# Patient Record
Sex: Female | Born: 1950 | Race: White | Hispanic: No | Marital: Married | Smoking: Never smoker
Health system: Southern US, Community
[De-identification: ages and names within clinical notes are randomized; demographics above are authoritative.]

## PROBLEM LIST (undated history)

## (undated) DIAGNOSIS — R809 Proteinuria, unspecified: Secondary | ICD-10-CM

## (undated) DIAGNOSIS — K228 Other specified diseases of esophagus: Secondary | ICD-10-CM

## (undated) DIAGNOSIS — E119 Type 2 diabetes mellitus without complications: Secondary | ICD-10-CM

## (undated) DIAGNOSIS — G629 Polyneuropathy, unspecified: Secondary | ICD-10-CM

## (undated) DIAGNOSIS — I10 Essential (primary) hypertension: Secondary | ICD-10-CM

## (undated) DIAGNOSIS — K2289 Other specified disease of esophagus: Secondary | ICD-10-CM

## (undated) DIAGNOSIS — I2510 Atherosclerotic heart disease of native coronary artery without angina pectoris: Secondary | ICD-10-CM

## (undated) DIAGNOSIS — N950 Postmenopausal bleeding: Secondary | ICD-10-CM

## (undated) DIAGNOSIS — E7800 Pure hypercholesterolemia, unspecified: Secondary | ICD-10-CM

## (undated) HISTORY — PX: OTHER SURGICAL HISTORY: SHX169

## (undated) HISTORY — PX: TONSILLECTOMY: SUR1361

## (undated) HISTORY — PX: BIOPSY BREAST: PRO8

## (undated) HISTORY — PX: TUBAL LIGATION: SHX77

---

## 2003-08-01 HISTORY — PX: OTHER SURGICAL HISTORY: SHX169

## 2004-04-15 HISTORY — PX: CHOLECYSTECTOMY: SHX55

## 2004-07-31 HISTORY — PX: CORONARY ANGIOPLASTY WITH STENT PLACEMENT: SHX49

## 2004-12-07 ENCOUNTER — Ambulatory Visit: Payer: Self-pay | Admitting: Unknown Physician Specialty

## 2004-12-29 ENCOUNTER — Ambulatory Visit: Payer: Self-pay | Admitting: Internal Medicine

## 2004-12-29 ENCOUNTER — Other Ambulatory Visit: Payer: Self-pay

## 2005-01-24 ENCOUNTER — Encounter: Payer: Self-pay | Admitting: Cardiology

## 2005-01-28 ENCOUNTER — Encounter: Payer: Self-pay | Admitting: Cardiology

## 2005-02-28 ENCOUNTER — Encounter: Payer: Self-pay | Admitting: Cardiology

## 2005-03-31 ENCOUNTER — Encounter: Payer: Self-pay | Admitting: Cardiology

## 2007-08-27 ENCOUNTER — Ambulatory Visit: Payer: Self-pay | Admitting: Unknown Physician Specialty

## 2007-10-14 ENCOUNTER — Ambulatory Visit: Payer: Self-pay | Admitting: Gastroenterology

## 2007-10-14 HISTORY — PX: ESOPHAGOGASTRODUODENOSCOPY: SHX1529

## 2008-10-20 ENCOUNTER — Ambulatory Visit: Payer: Self-pay | Admitting: Unknown Physician Specialty

## 2009-12-02 ENCOUNTER — Ambulatory Visit: Payer: Self-pay | Admitting: Unknown Physician Specialty

## 2010-01-27 ENCOUNTER — Ambulatory Visit: Payer: Self-pay | Admitting: Gastroenterology

## 2010-07-31 HISTORY — PX: ABDOMINAL HYSTERECTOMY: SHX81

## 2010-12-14 ENCOUNTER — Ambulatory Visit: Payer: Self-pay | Admitting: Unknown Physician Specialty

## 2011-05-25 ENCOUNTER — Ambulatory Visit: Payer: Self-pay | Admitting: Obstetrics & Gynecology

## 2011-06-02 ENCOUNTER — Ambulatory Visit: Payer: Self-pay | Admitting: Obstetrics & Gynecology

## 2011-06-07 LAB — PATHOLOGY REPORT

## 2012-01-15 ENCOUNTER — Ambulatory Visit: Payer: Self-pay | Admitting: Unknown Physician Specialty

## 2013-05-13 ENCOUNTER — Ambulatory Visit: Payer: Self-pay | Admitting: Internal Medicine

## 2014-06-22 ENCOUNTER — Ambulatory Visit: Payer: Self-pay | Admitting: Internal Medicine

## 2014-09-03 ENCOUNTER — Emergency Department: Payer: Self-pay | Admitting: Emergency Medicine

## 2014-09-03 LAB — URINALYSIS, COMPLETE
Bilirubin,UR: NEGATIVE
NITRITE: NEGATIVE
PH: 5 (ref 4.5–8.0)
Protein: 30
RBC,UR: 239 /HPF (ref 0–5)
Specific Gravity: 1.028 (ref 1.003–1.030)
Squamous Epithelial: 6
WBC UR: 8 /HPF (ref 0–5)

## 2014-09-03 LAB — CBC WITH DIFFERENTIAL/PLATELET
Basophil #: 0 10*3/uL (ref 0.0–0.1)
Basophil %: 0.4 %
EOS PCT: 1.7 %
Eosinophil #: 0.2 10*3/uL (ref 0.0–0.7)
HCT: 36.9 % (ref 35.0–47.0)
HGB: 12 g/dL (ref 12.0–16.0)
LYMPHS PCT: 13.9 %
Lymphocyte #: 1.3 10*3/uL (ref 1.0–3.6)
MCH: 27.9 pg (ref 26.0–34.0)
MCHC: 32.5 g/dL (ref 32.0–36.0)
MCV: 86 fL (ref 80–100)
MONO ABS: 0.6 x10 3/mm (ref 0.2–0.9)
MONOS PCT: 7 %
Neutrophil #: 7 10*3/uL — ABNORMAL HIGH (ref 1.4–6.5)
Neutrophil %: 77 %
PLATELETS: 172 10*3/uL (ref 150–440)
RBC: 4.3 10*6/uL (ref 3.80–5.20)
RDW: 14.5 % (ref 11.5–14.5)
WBC: 9.1 10*3/uL (ref 3.6–11.0)

## 2014-09-03 LAB — COMPREHENSIVE METABOLIC PANEL
ALBUMIN: 3.4 g/dL (ref 3.4–5.0)
ALT: 22 U/L (ref 14–63)
Alkaline Phosphatase: 98 U/L (ref 46–116)
Anion Gap: 9 (ref 7–16)
BUN: 17 mg/dL (ref 7–18)
Bilirubin,Total: 1.2 mg/dL — ABNORMAL HIGH (ref 0.2–1.0)
Calcium, Total: 8.8 mg/dL (ref 8.5–10.1)
Chloride: 103 mmol/L (ref 98–107)
Co2: 24 mmol/L (ref 21–32)
Creatinine: 1.11 mg/dL (ref 0.60–1.30)
EGFR (African American): >60
GFR CALC NON AF AMER: 53 — AB
GLUCOSE: 202 mg/dL — AB (ref 65–99)
OSMOLALITY: 279 (ref 275–301)
Potassium: 4.1 mmol/L (ref 3.5–5.1)
SGOT(AST): 27 U/L (ref 15–37)
SODIUM: 136 mmol/L (ref 136–145)
Total Protein: 7.1 g/dL (ref 6.4–8.2)

## 2014-09-09 ENCOUNTER — Ambulatory Visit: Payer: Self-pay | Admitting: Urology

## 2014-09-24 ENCOUNTER — Ambulatory Visit: Payer: Self-pay

## 2015-01-14 ENCOUNTER — Ambulatory Visit: Payer: 59 | Admitting: Anesthesiology

## 2015-01-14 ENCOUNTER — Ambulatory Visit
Admission: RE | Admit: 2015-01-14 | Discharge: 2015-01-14 | Disposition: A | Discharge status: Home / Self Care | Payer: 59 | Source: Ambulatory Visit | Attending: Gastroenterology | Admitting: Gastroenterology

## 2015-01-14 ENCOUNTER — Encounter: Payer: Self-pay | Admitting: Anesthesiology

## 2015-01-14 ENCOUNTER — Encounter
Admission: RE | Disposition: A | Discharge status: Home / Self Care | Payer: Self-pay | Source: Ambulatory Visit | Attending: Gastroenterology

## 2015-01-14 DIAGNOSIS — Z1211 Encounter for screening for malignant neoplasm of colon: Secondary | ICD-10-CM | POA: Diagnosis not present

## 2015-01-14 DIAGNOSIS — I2510 Atherosclerotic heart disease of native coronary artery without angina pectoris: Secondary | ICD-10-CM | POA: Insufficient documentation

## 2015-01-14 DIAGNOSIS — I10 Essential (primary) hypertension: Secondary | ICD-10-CM | POA: Diagnosis not present

## 2015-01-14 DIAGNOSIS — Z86010 Personal history of colonic polyps: Secondary | ICD-10-CM | POA: Diagnosis not present

## 2015-01-14 DIAGNOSIS — Z7982 Long term (current) use of aspirin: Secondary | ICD-10-CM | POA: Insufficient documentation

## 2015-01-14 DIAGNOSIS — Z79899 Other long term (current) drug therapy: Secondary | ICD-10-CM | POA: Insufficient documentation

## 2015-01-14 DIAGNOSIS — E119 Type 2 diabetes mellitus without complications: Secondary | ICD-10-CM | POA: Diagnosis not present

## 2015-01-14 HISTORY — DX: Polyneuropathy, unspecified: G62.9

## 2015-01-14 HISTORY — DX: Essential (primary) hypertension: I10

## 2015-01-14 HISTORY — PX: COLONOSCOPY: SHX5424

## 2015-01-14 HISTORY — DX: Type 2 diabetes mellitus without complications: E11.9

## 2015-01-14 HISTORY — DX: Pure hypercholesterolemia, unspecified: E78.00

## 2015-01-14 HISTORY — DX: Proteinuria, unspecified: R80.9

## 2015-01-14 HISTORY — DX: Other specified diseases of esophagus: K22.8

## 2015-01-14 HISTORY — DX: Other specified disease of esophagus: K22.89

## 2015-01-14 HISTORY — DX: Atherosclerotic heart disease of native coronary artery without angina pectoris: I25.10

## 2015-01-14 HISTORY — DX: Postmenopausal bleeding: N95.0

## 2015-01-14 LAB — GLUCOSE, CAPILLARY: Glucose-Capillary: 107 mg/dL — ABNORMAL HIGH (ref 65–99)

## 2015-01-14 SURGERY — COLONOSCOPY
Anesthesia: General

## 2015-01-14 MED ORDER — LIDOCAINE HCL (CARDIAC) 20 MG/ML IV SOLN
INTRAVENOUS | Status: DC | PRN
  Administered 2015-01-14: 60 mg via INTRAVENOUS

## 2015-01-14 MED ORDER — MIDAZOLAM HCL 2 MG/2ML IJ SOLN
INTRAMUSCULAR | Status: DC | PRN
  Administered 2015-01-14: 2 mg via INTRAVENOUS

## 2015-01-14 MED ORDER — METOPROLOL SUCCINATE ER 50 MG PO TB24
ORAL_TABLET | ORAL | Status: AC
  Administered 2015-01-14: 50 mg via ORAL
  Filled 2015-01-14: qty 1

## 2015-01-14 MED ORDER — SODIUM CHLORIDE 0.9 % IV SOLN
INTRAVENOUS | Status: DC
  Administered 2015-01-14: 08:00:00 via INTRAVENOUS

## 2015-01-14 MED ORDER — PROPOFOL INFUSION 10 MG/ML OPTIME
INTRAVENOUS | Status: DC | PRN
  Administered 2015-01-14: 130 ug/kg/min via INTRAVENOUS

## 2015-01-14 MED ORDER — SODIUM CHLORIDE 0.9 % IV SOLN
INTRAVENOUS | Status: DC
  Administered 2015-01-14: 1000 mL via INTRAVENOUS

## 2015-01-14 MED ORDER — PROPOFOL 10 MG/ML IV BOLUS
INTRAVENOUS | Status: DC | PRN
  Administered 2015-01-14: 50 mg via INTRAVENOUS

## 2015-01-14 NOTE — H&P (Signed)
  Date of Initial H&P:01/01/2015  History reviewed, patient examined, no change in status, stable for surgery.

## 2015-01-14 NOTE — Op Note (Signed)
[facility] Patient Name: Alexandra Hudson Procedure Date: 01/14/2015 7:32 AM MRN: [id number] Account #: [id number] Date of Birth: June 05, 1951 Admit Type: Outpatient Age: 64 Room: [facility] ENDO ROOM 4 Gender: Female Note Status: Finalized Procedure:         Colonoscopy Indications:       Personal history of colonic polyps Providers:         Lupita Dawn. Candace Cruise, MD Referring MD:      Glendon Axe (Referring MD) Medicines:         Monitored Anesthesia Care Complications:     No immediate complications. Procedure:         Pre-Anesthesia Assessment:                    - Prior to the procedure, a History and Physical was                     performed, and patient medications, allergies and                     sensitivities were reviewed. The patient's tolerance of                     previous anesthesia was reviewed.                    - The risks and benefits of the procedure and the sedation                     options and risks were discussed with the patient. All                     questions were answered and informed consent was obtained.                    - After reviewing the risks and benefits, the patient was                     deemed in satisfactory condition to undergo the procedure.                    After obtaining informed consent, the colonoscope was                     passed under direct vision. Throughout the procedure, the                     patient's blood pressure, pulse, and oxygen saturations                     were monitored continuously. The Colonoscope was                     introduced through the anus and advanced to the the cecum,                     identified by appendiceal orifice and ileocecal valve. The                     colonoscopy was performed without difficulty. Findings:      The colon (entire examined portion) appeared normal. Impression:        - The entire examined colon is normal.                    -  No  specimens collected. Recommendation:    - Discharge patient to home.                    - Repeat colonoscopy in 5 years for surveillance.                    - The findings and recommendations were discussed with the                     patient. Procedure Code(s): --- Professional ---                    [phone], Colonoscopy, flexible; diagnostic, including                     collection of specimen(s) by brushing or washing, when                     performed (separate procedure) Diagnosis Code(s): --- Professional ---                    Z86.010, Personal history of colonic polyps CPT copyright 2014 American Medical Association. All rights reserved. The codes documented in this report are preliminary and upon coder review may  be revised to meet current compliance requirements. Hulen Luster, MD 01/14/2015 8:44:02 AM This report has been signed electronically. Number of Addenda: 0 Note Initiated On: 01/14/2015 7:32 AM Scope Withdrawal Time: 0 hours 9 minutes 26 seconds  Total Procedure Duration: 0 hours 16 minutes 26 seconds       [facility]

## 2015-01-14 NOTE — Transfer of Care (Signed)
Immediate Anesthesia Transfer of Care Note  Patient: Alexandra Hudson  Procedure(s) Performed: Procedure(s): COLONOSCOPY (N/A)  Patient Location: Endoscopy Unit  Anesthesia Type:General  Level of Consciousness: awake, alert , oriented and patient cooperative  Airway & Oxygen Therapy: Patient Spontanous Breathing and Patient connected to nasal cannula oxygen  Post-op Assessment: Report given to RN, Post -op Vital signs reviewed and stable and Patient moving all extremities X 4  Post vital signs: Reviewed and stable  Last Vitals:  Filed Vitals:   01/14/15 0849  BP: 137/52  Pulse: 78  Temp: 36.9 C  Resp: 12    Complications: No apparent anesthesia complications

## 2015-01-14 NOTE — Anesthesia Preprocedure Evaluation (Addendum)
Anesthesia Evaluation  Patient identified by MRN, date of birth, ID band Patient awake    Reviewed: Allergy & Precautions, NPO status , Patient's Chart, lab work & pertinent test results, reviewed documented beta blocker date and time   Airway Mallampati: II  TM Distance: >3 FB     Dental   Pulmonary          Cardiovascular hypertension, + CAD and + Cardiac Stents     Neuro/Psych  Neuromuscular disease    GI/Hepatic   Endo/Other  diabetes, Type 2  Renal/GU      Musculoskeletal   Abdominal   Peds  Hematology   Anesthesia Other Findings Obesity.stent in 2005.Metoprolol given at 0730.  Reproductive/Obstetrics                            Anesthesia Physical Anesthesia Plan  ASA: III  Anesthesia Plan: General   Post-op Pain Management:    Induction: Intravenous  Airway Management Planned: Nasal Cannula  Additional Equipment:   Intra-op Plan:   Post-operative Plan:   Informed Consent: I have reviewed the patients History and Physical, chart, labs and discussed the procedure including the risks, benefits and alternatives for the proposed anesthesia with the patient or authorized representative who has indicated his/her understanding and acceptance.     Plan Discussed with: CRNA  Anesthesia Plan Comments:         Anesthesia Quick Evaluation

## 2015-01-14 NOTE — Anesthesia Postprocedure Evaluation (Signed)
  Anesthesia Post-op Note  Patient: Alexandra Hudson  Procedure(s) Performed: Procedure(s): COLONOSCOPY (N/A)  Anesthesia type:General  Patient location: PACU  Post pain: Pain level controlled  Post assessment: Post-op Vital signs reviewed, Patient's Cardiovascular Status Stable, Respiratory Function Stable, Patent Airway and No signs of Nausea or vomiting  Post vital signs: Reviewed and stable  Last Vitals:  Filed Vitals:   01/14/15 0910  BP: 136/96  Pulse: 71  Temp:   Resp: 14    Level of consciousness: awake, alert  and patient cooperative  Complications: No apparent anesthesia complications

## 2015-01-25 ENCOUNTER — Encounter: Payer: Self-pay | Admitting: Gastroenterology

## 2015-02-15 DIAGNOSIS — I10 Essential (primary) hypertension: Secondary | ICD-10-CM | POA: Insufficient documentation

## 2015-03-24 DIAGNOSIS — G608 Other hereditary and idiopathic neuropathies: Secondary | ICD-10-CM | POA: Insufficient documentation

## 2015-03-24 DIAGNOSIS — E1142 Type 2 diabetes mellitus with diabetic polyneuropathy: Secondary | ICD-10-CM | POA: Insufficient documentation

## 2015-03-24 DIAGNOSIS — E782 Mixed hyperlipidemia: Secondary | ICD-10-CM | POA: Insufficient documentation

## 2015-03-24 DIAGNOSIS — Z780 Asymptomatic menopausal state: Secondary | ICD-10-CM | POA: Insufficient documentation

## 2015-03-29 ENCOUNTER — Other Ambulatory Visit: Payer: Self-pay | Admitting: Internal Medicine

## 2015-03-29 DIAGNOSIS — Z1239 Encounter for other screening for malignant neoplasm of breast: Secondary | ICD-10-CM

## 2015-06-28 ENCOUNTER — Ambulatory Visit: Payer: 59

## 2015-07-15 ENCOUNTER — Ambulatory Visit
Admission: RE | Admit: 2015-07-15 | Discharge: 2015-07-15 | Disposition: A | Discharge status: Home / Self Care | Payer: 59 | Source: Ambulatory Visit | Attending: Internal Medicine | Admitting: Internal Medicine

## 2015-07-15 DIAGNOSIS — Z1239 Encounter for other screening for malignant neoplasm of breast: Secondary | ICD-10-CM

## 2015-07-15 DIAGNOSIS — Z1231 Encounter for screening mammogram for malignant neoplasm of breast: Secondary | ICD-10-CM | POA: Insufficient documentation

## 2015-07-15 DIAGNOSIS — Z6841 Body Mass Index (BMI) 40.0 and over, adult: Secondary | ICD-10-CM | POA: Insufficient documentation

## 2016-04-20 ENCOUNTER — Other Ambulatory Visit: Payer: Self-pay | Admitting: Internal Medicine

## 2016-04-20 DIAGNOSIS — Z1239 Encounter for other screening for malignant neoplasm of breast: Secondary | ICD-10-CM

## 2016-05-27 IMAGING — CR DG ABDOMEN 1V
1 series · 2 of 2 positions shown · non-contrast
Comparison: CT abdomen pelvis [DATE]

CLINICAL DATA: Left kidney stone.  No pain today

EXAM:
ABDOMEN - 1 VIEW

[Series 1: kdxr kidney ureter bladder · 0.14mm/px · 2 of 2 slices shown]
[im 1/2]
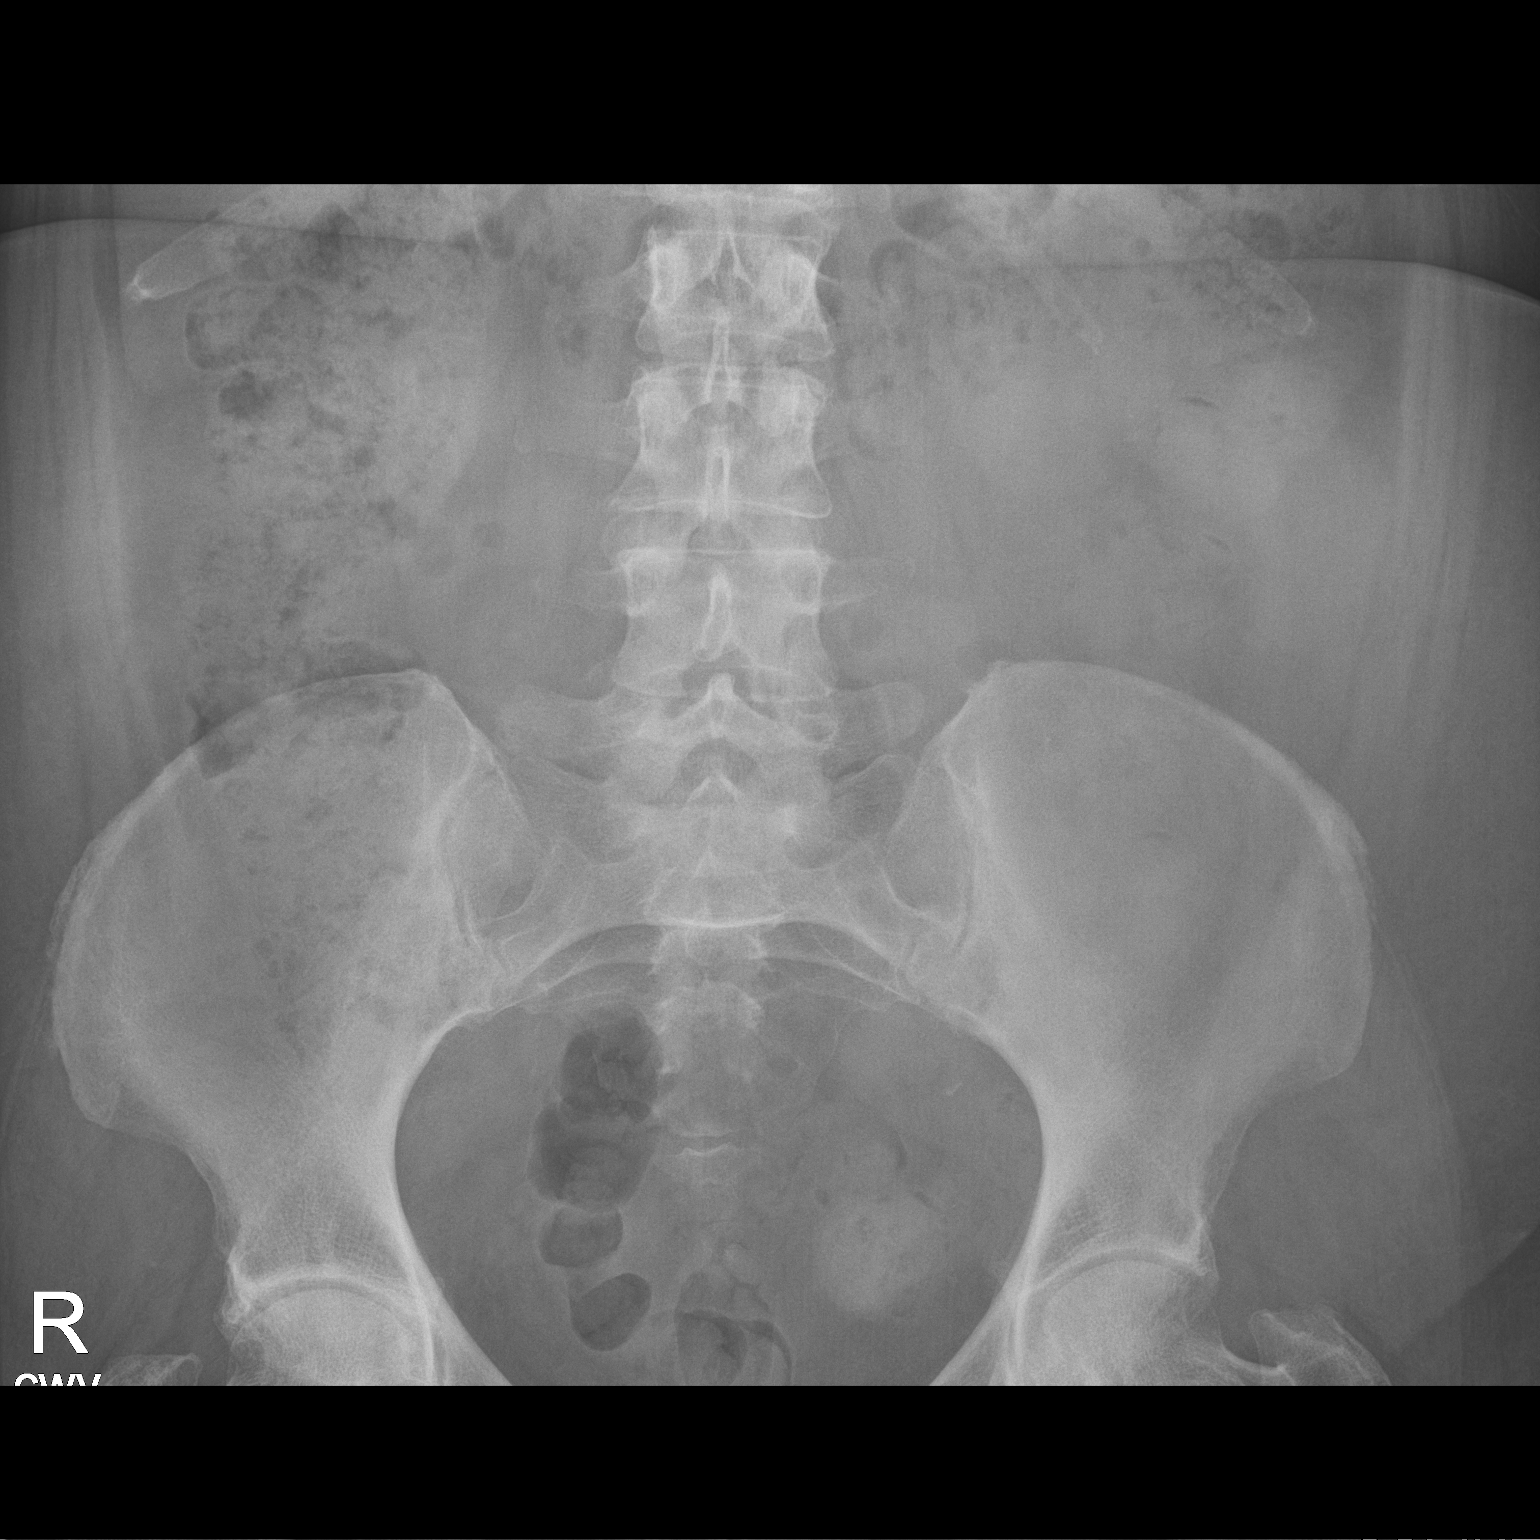
[im 2/2]
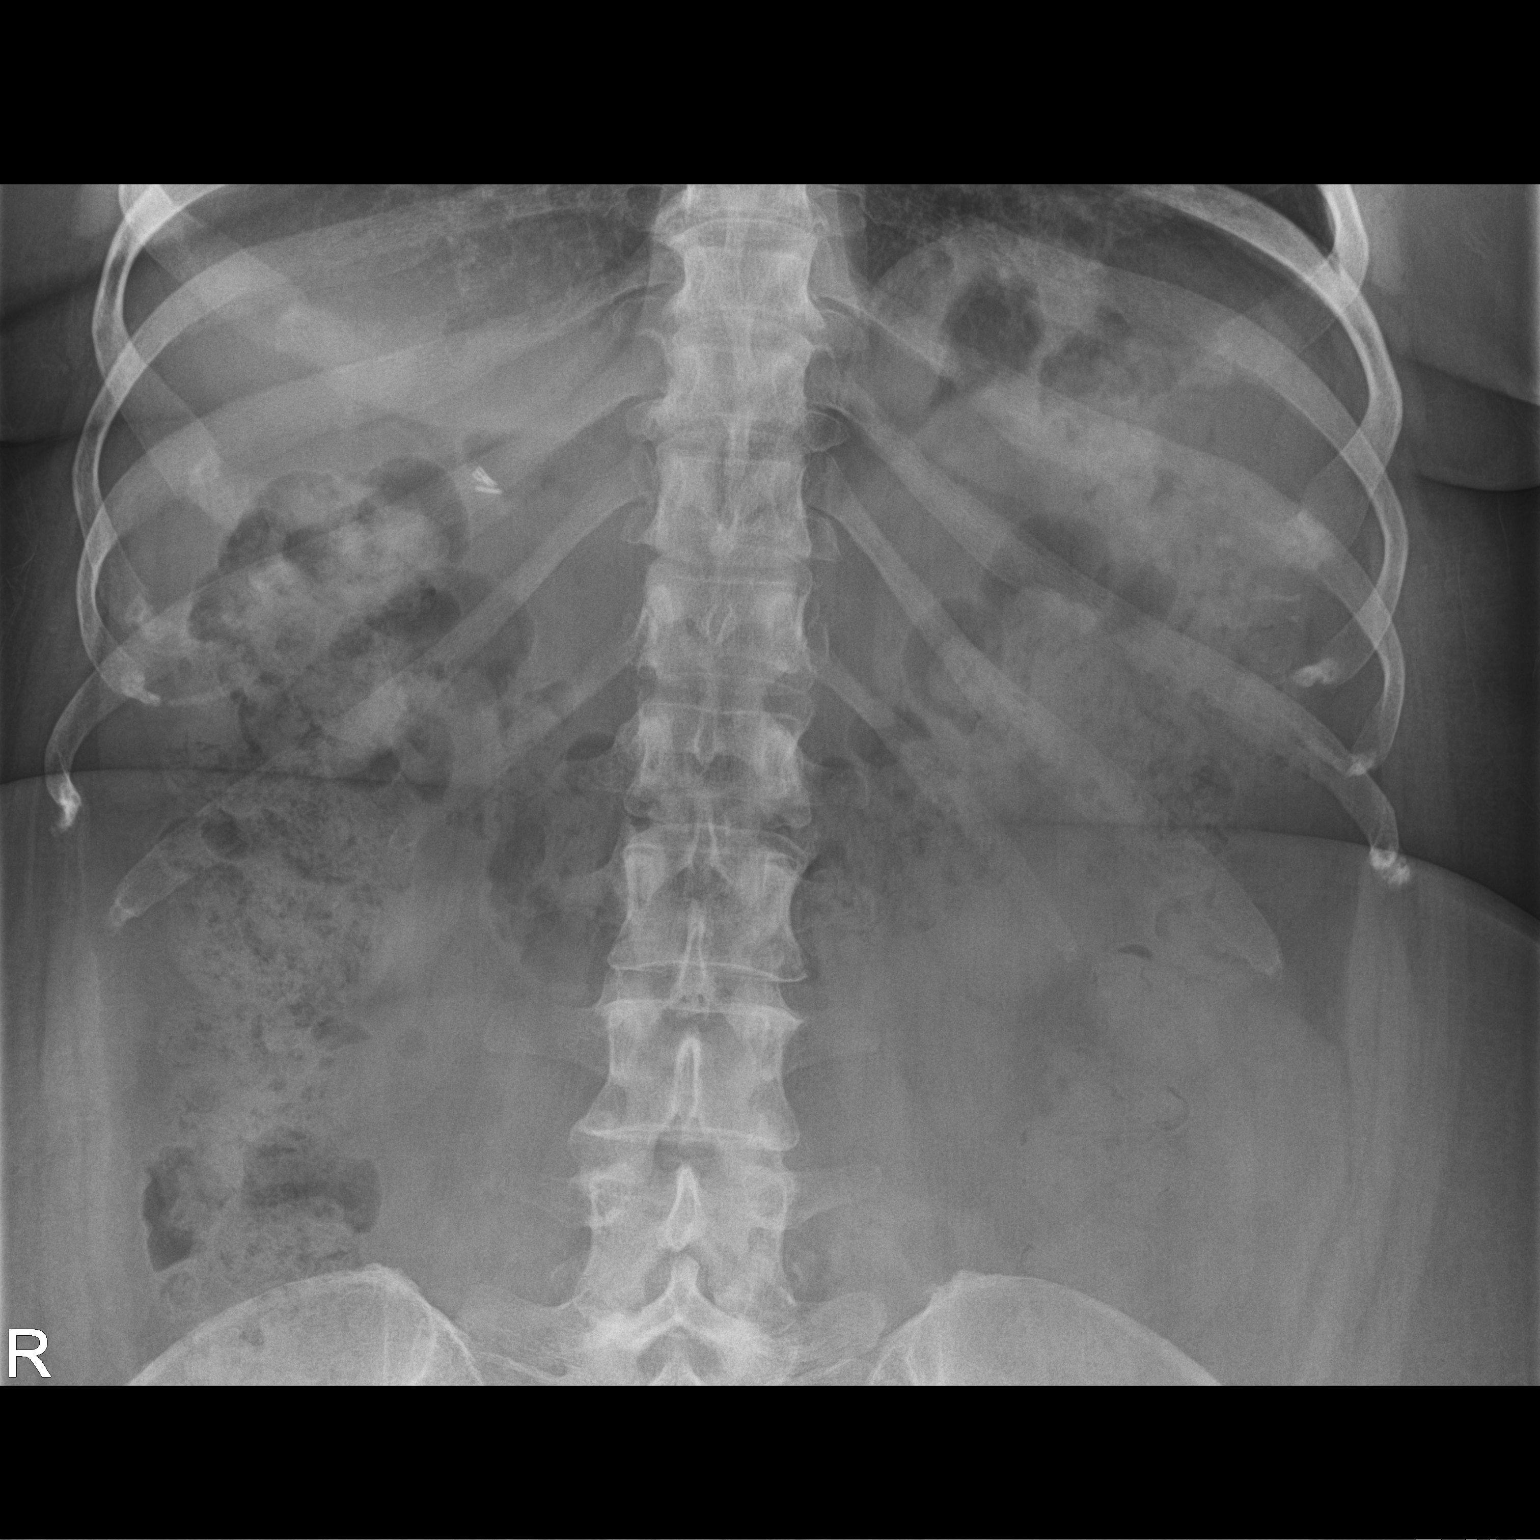

[2 of 2 positions shown; findings below may reference images not displayed]

FINDINGS: 4 mm calculus distal left ureter no longer visualized. Vascular
calcification below the left SI joint as noted on CT. No other renal
calculi.

Constipation with stool throughout the colon. Negative for bowel
obstruction. Surgical clips in the gallbladder fossa.
IMPRESSION: Distal left ureteral calculus no longer visualized.

Constipation.

## 2016-06-11 IMAGING — US US RENAL KIDNEY
1 series · 14 of 25 positions shown · non-contrast
Comparison: CT abdomen pelvis dated 09/04/2014

CLINICAL DATA: Recent hydronephrosis, recheck for kidney stone

EXAM:
RENAL/URINARY TRACT ULTRASOUND COMPLETE

[Series 1: us renal kidney · 0.28mm/px · 14 of 33 slices shown]
[im 1/33]
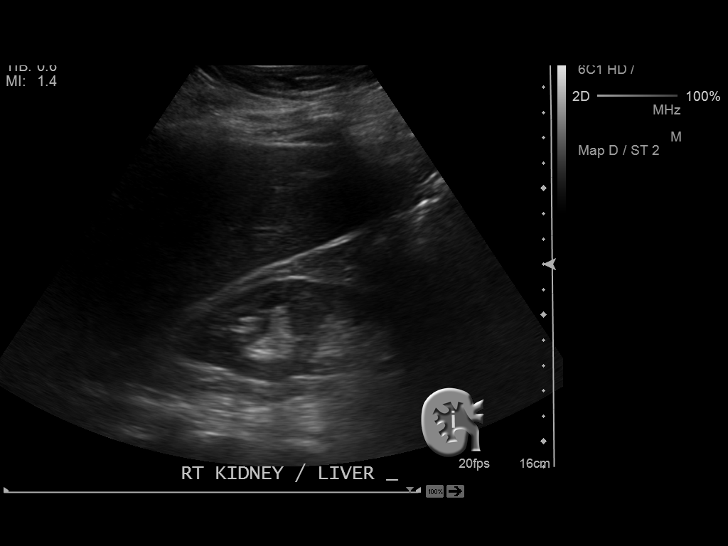
[im 3/33]
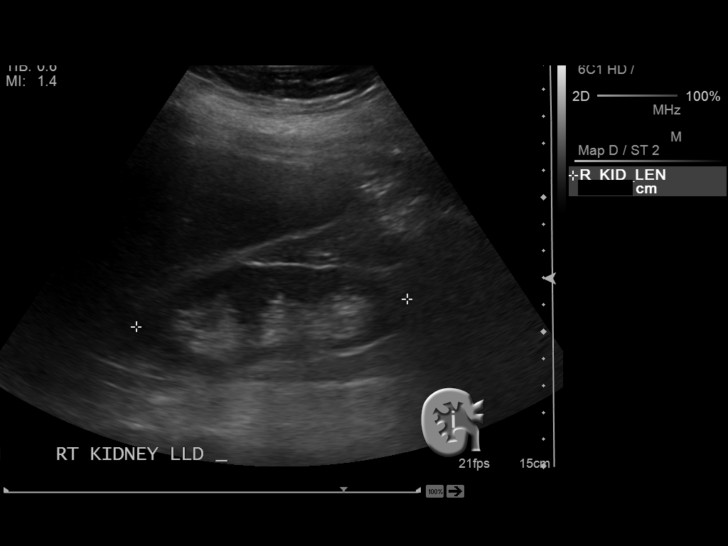
[im 6/33]
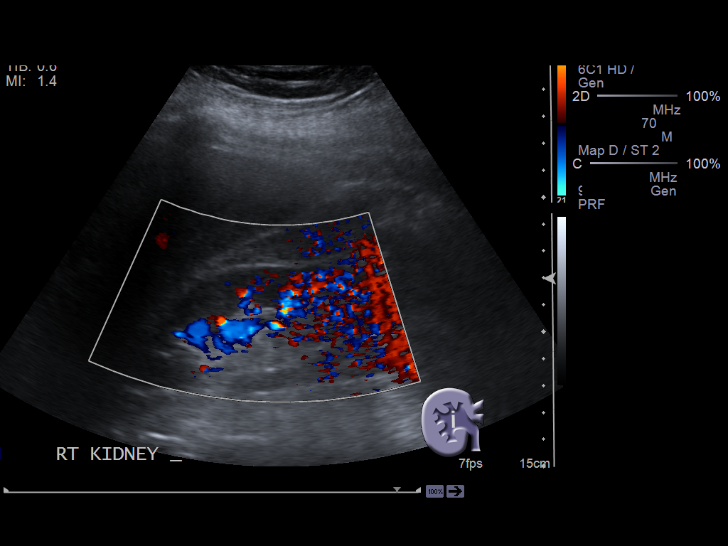
[im 9/33]
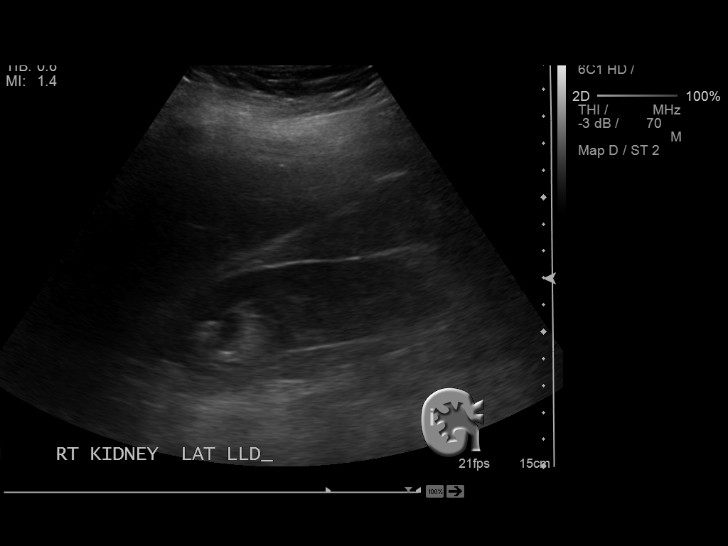
[im 11/33]
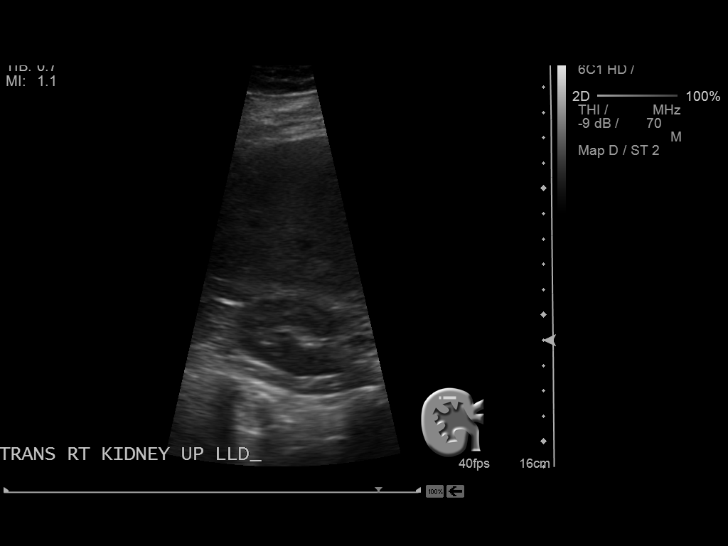
[im 13/33]
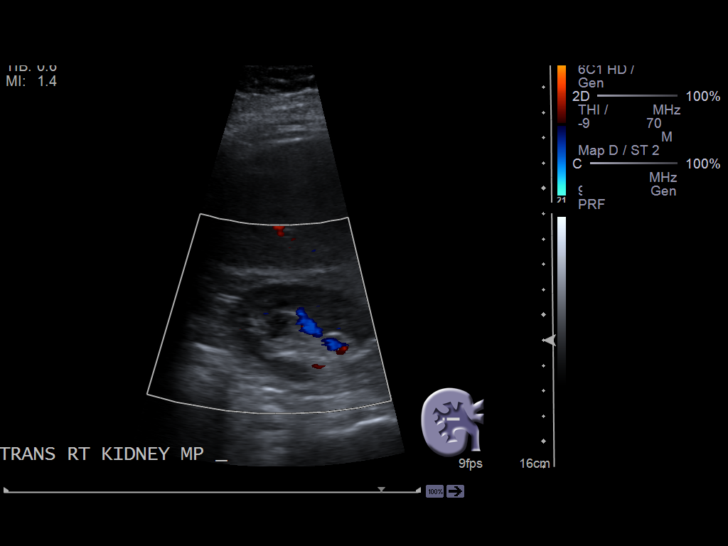
[im 15/33]
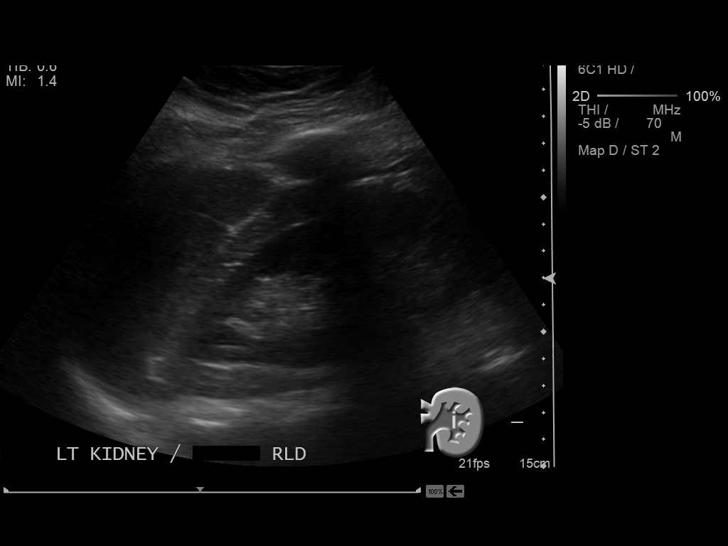
[im 18/33]
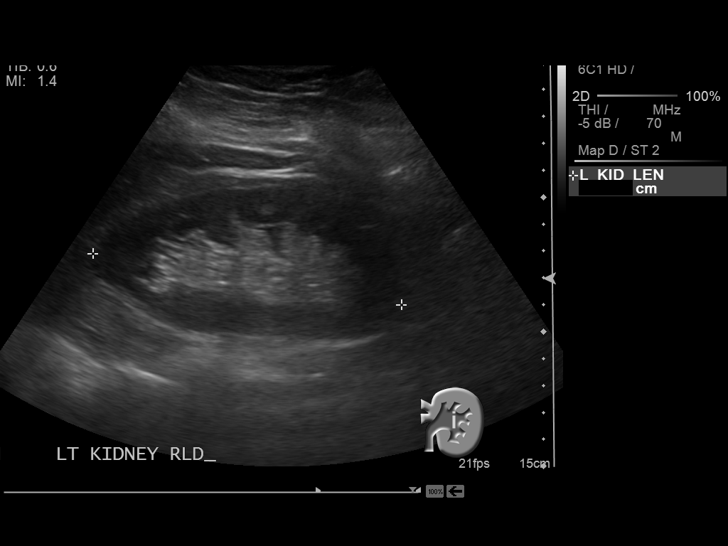
[im 21/33]
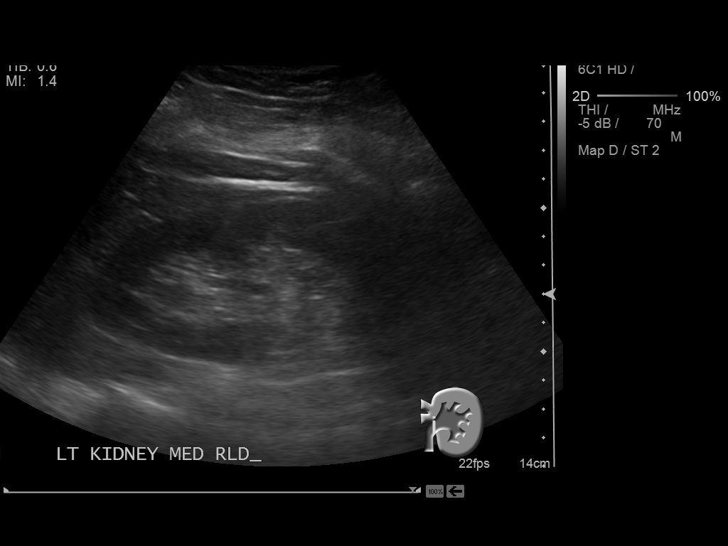
[im 22/33]
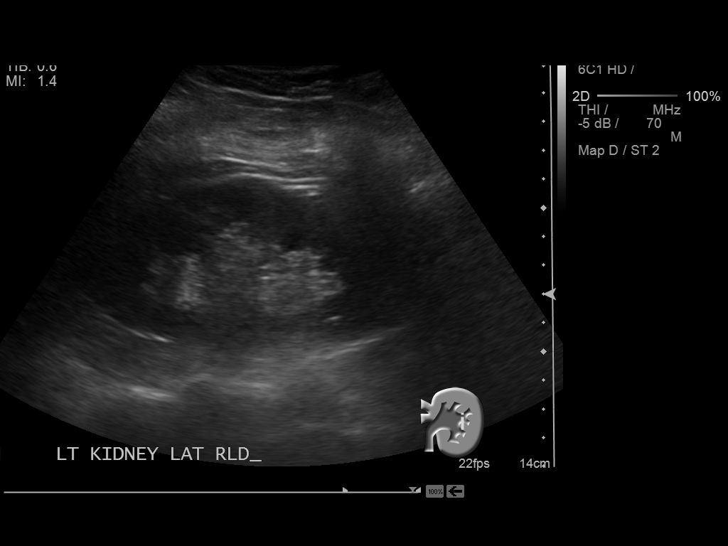
[im 25/33]
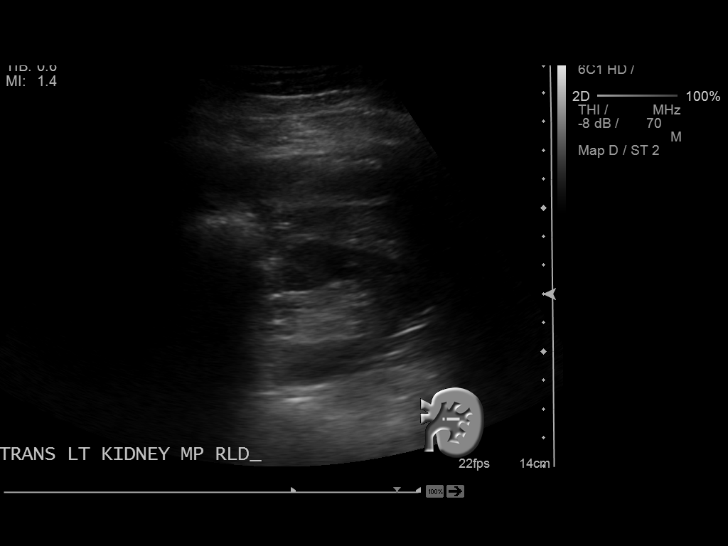
[im 27/33]
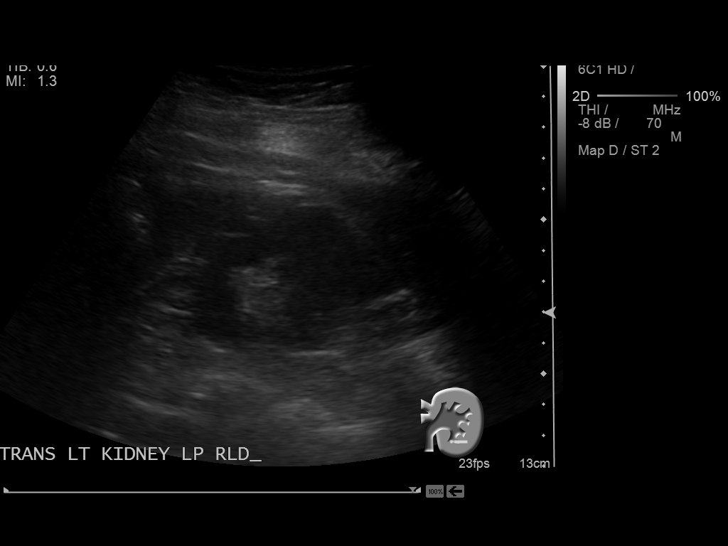
[im 30/33]
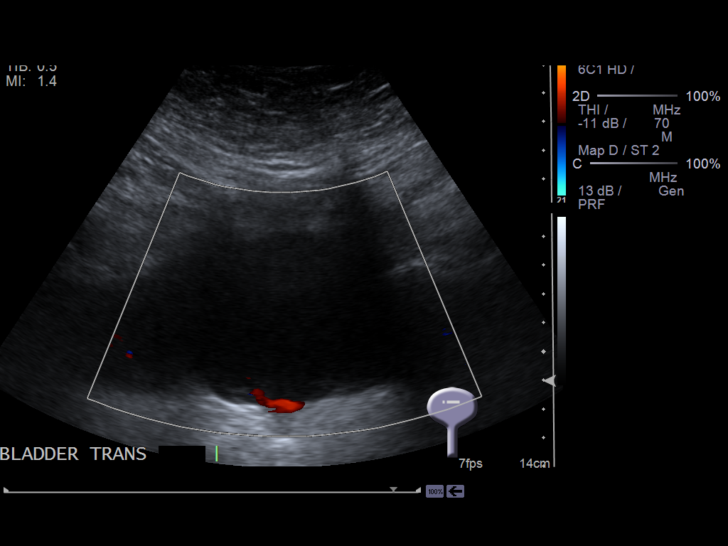
[im 33/33]
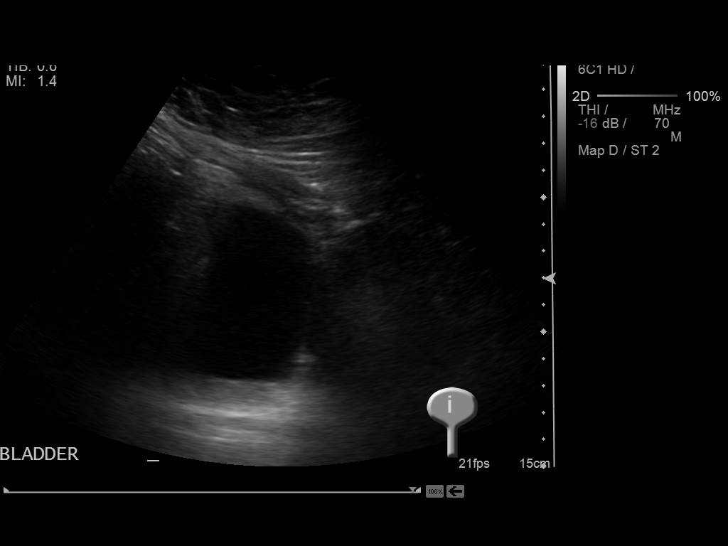

[14 of 25 positions shown; findings below may reference images not displayed]

FINDINGS: Right Kidney:

Length:  10.1 cm.  No mass, renal calculi, or hydronephrosis.

Left Kidney:

Length: 11.6 cm.  No mass, renal calculi, or hydronephrosis.

Bladder:

Within normal limits.  Bilateral bladder jets are visualized.
IMPRESSION: Negative renal ultrasound.

Specifically, no renal calculi or hydronephrosis.

## 2016-07-18 ENCOUNTER — Ambulatory Visit: Payer: 59

## 2016-07-19 ENCOUNTER — Ambulatory Visit
Admission: RE | Admit: 2016-07-19 | Discharge: 2016-07-19 | Disposition: A | Discharge status: Home / Self Care | Payer: 59 | Source: Ambulatory Visit | Attending: Internal Medicine | Admitting: Internal Medicine

## 2016-07-19 ENCOUNTER — Encounter: Payer: Self-pay | Admitting: Radiology

## 2016-07-19 DIAGNOSIS — Z1231 Encounter for screening mammogram for malignant neoplasm of breast: Secondary | ICD-10-CM | POA: Diagnosis present

## 2016-07-19 DIAGNOSIS — Z1239 Encounter for other screening for malignant neoplasm of breast: Secondary | ICD-10-CM

## 2017-04-26 DIAGNOSIS — E7800 Pure hypercholesterolemia, unspecified: Secondary | ICD-10-CM | POA: Insufficient documentation

## 2017-08-14 ENCOUNTER — Other Ambulatory Visit: Payer: Self-pay | Admitting: Internal Medicine

## 2017-08-14 DIAGNOSIS — Z1239 Encounter for other screening for malignant neoplasm of breast: Secondary | ICD-10-CM

## 2018-01-08 ENCOUNTER — Other Ambulatory Visit: Payer: Self-pay | Admitting: Internal Medicine

## 2018-01-08 DIAGNOSIS — Z1231 Encounter for screening mammogram for malignant neoplasm of breast: Secondary | ICD-10-CM

## 2018-02-08 ENCOUNTER — Ambulatory Visit
Admission: RE | Admit: 2018-02-08 | Discharge: 2018-02-08 | Disposition: A | Discharge status: Home / Self Care | Payer: Managed Care, Other (non HMO) | Source: Ambulatory Visit | Attending: Internal Medicine | Admitting: Internal Medicine

## 2018-02-08 DIAGNOSIS — Z1231 Encounter for screening mammogram for malignant neoplasm of breast: Secondary | ICD-10-CM | POA: Diagnosis present

## 2018-02-21 HISTORY — PX: TOOTH EXTRACTION: SUR596

## 2018-05-13 DIAGNOSIS — J3089 Other allergic rhinitis: Secondary | ICD-10-CM | POA: Insufficient documentation

## 2018-09-09 HISTORY — PX: WISDOM TOOTH EXTRACTION: SHX21

## 2019-05-27 ENCOUNTER — Other Ambulatory Visit: Payer: Self-pay | Admitting: Internal Medicine

## 2019-05-27 DIAGNOSIS — Z1231 Encounter for screening mammogram for malignant neoplasm of breast: Secondary | ICD-10-CM

## 2019-09-09 ENCOUNTER — Ambulatory Visit
Admission: RE | Admit: 2019-09-09 | Discharge: 2019-09-09 | Disposition: A | Discharge status: Home / Self Care | Payer: BC Managed Care – PPO | Source: Ambulatory Visit | Attending: Internal Medicine | Admitting: Internal Medicine

## 2019-09-09 DIAGNOSIS — Z1231 Encounter for screening mammogram for malignant neoplasm of breast: Secondary | ICD-10-CM | POA: Diagnosis present

## 2020-03-18 ENCOUNTER — Other Ambulatory Visit: Payer: Self-pay

## 2020-03-18 ENCOUNTER — Other Ambulatory Visit
Admission: RE | Admit: 2020-03-18 | Discharge: 2020-03-18 | Disposition: A | Discharge status: Home / Self Care | Payer: BC Managed Care – PPO | Source: Ambulatory Visit | Attending: Gastroenterology | Admitting: Gastroenterology

## 2020-03-18 DIAGNOSIS — Z20822 Contact with and (suspected) exposure to covid-19: Secondary | ICD-10-CM | POA: Diagnosis not present

## 2020-03-18 DIAGNOSIS — Z01812 Encounter for preprocedural laboratory examination: Secondary | ICD-10-CM | POA: Diagnosis present

## 2020-03-19 LAB — SARS CORONAVIRUS 2 (TAT 6-24 HRS): SARS Coronavirus 2: NEGATIVE

## 2020-03-22 ENCOUNTER — Ambulatory Visit
Admission: RE | Admit: 2020-03-22 | Discharge: 2020-03-22 | Disposition: A | Discharge status: Home / Self Care | Payer: BC Managed Care – PPO | Attending: Gastroenterology | Admitting: Gastroenterology

## 2020-03-22 ENCOUNTER — Encounter: Payer: Self-pay | Admitting: *Deleted

## 2020-03-22 ENCOUNTER — Encounter
Admission: RE | Disposition: A | Discharge status: Home / Self Care | Payer: Self-pay | Source: Home / Self Care | Attending: Gastroenterology

## 2020-03-22 ENCOUNTER — Other Ambulatory Visit: Payer: Self-pay

## 2020-03-22 ENCOUNTER — Ambulatory Visit: Payer: BC Managed Care – PPO | Admitting: Anesthesiology

## 2020-03-22 DIAGNOSIS — Z6841 Body Mass Index (BMI) 40.0 and over, adult: Secondary | ICD-10-CM | POA: Insufficient documentation

## 2020-03-22 DIAGNOSIS — E1142 Type 2 diabetes mellitus with diabetic polyneuropathy: Secondary | ICD-10-CM | POA: Insufficient documentation

## 2020-03-22 DIAGNOSIS — E7800 Pure hypercholesterolemia, unspecified: Secondary | ICD-10-CM | POA: Diagnosis not present

## 2020-03-22 DIAGNOSIS — Z79899 Other long term (current) drug therapy: Secondary | ICD-10-CM | POA: Insufficient documentation

## 2020-03-22 DIAGNOSIS — Z1211 Encounter for screening for malignant neoplasm of colon: Secondary | ICD-10-CM | POA: Insufficient documentation

## 2020-03-22 DIAGNOSIS — Z7982 Long term (current) use of aspirin: Secondary | ICD-10-CM | POA: Insufficient documentation

## 2020-03-22 DIAGNOSIS — I2510 Atherosclerotic heart disease of native coronary artery without angina pectoris: Secondary | ICD-10-CM | POA: Diagnosis not present

## 2020-03-22 DIAGNOSIS — Z7902 Long term (current) use of antithrombotics/antiplatelets: Secondary | ICD-10-CM | POA: Insufficient documentation

## 2020-03-22 DIAGNOSIS — Z8719 Personal history of other diseases of the digestive system: Secondary | ICD-10-CM | POA: Insufficient documentation

## 2020-03-22 DIAGNOSIS — I10 Essential (primary) hypertension: Secondary | ICD-10-CM | POA: Diagnosis not present

## 2020-03-22 DIAGNOSIS — Z7984 Long term (current) use of oral hypoglycemic drugs: Secondary | ICD-10-CM | POA: Diagnosis not present

## 2020-03-22 HISTORY — PX: COLONOSCOPY: SHX5424

## 2020-03-22 LAB — GLUCOSE, CAPILLARY: Glucose-Capillary: 114 mg/dL — ABNORMAL HIGH (ref 70–99)

## 2020-03-22 SURGERY — COLONOSCOPY
Anesthesia: General

## 2020-03-22 MED ORDER — PROPOFOL 10 MG/ML IV BOLUS
INTRAVENOUS | Status: AC
  Filled 2020-03-22: qty 20

## 2020-03-22 MED ORDER — SODIUM CHLORIDE 0.9 % IV SOLN: INTRAVENOUS | Status: DC

## 2020-03-22 MED ORDER — LIDOCAINE HCL (CARDIAC) PF 100 MG/5ML IV SOSY
PREFILLED_SYRINGE | INTRAVENOUS | Status: DC | PRN
  Administered 2020-03-22: 30 mg via INTRAVENOUS

## 2020-03-22 MED ORDER — PROPOFOL 10 MG/ML IV BOLUS
INTRAVENOUS | Status: DC | PRN
  Administered 2020-03-22: 50 mg via INTRAVENOUS
  Administered 2020-03-22: 20 mg via INTRAVENOUS
  Administered 2020-03-22 (×2): 50 mg via INTRAVENOUS
  Administered 2020-03-22: 80 mg via INTRAVENOUS

## 2020-03-22 MED ORDER — PROPOFOL 500 MG/50ML IV EMUL
INTRAVENOUS | Status: DC | PRN
  Administered 2020-03-22: 125 ug/kg/min via INTRAVENOUS

## 2020-03-22 MED ORDER — PROPOFOL 500 MG/50ML IV EMUL
INTRAVENOUS | Status: AC
  Filled 2020-03-22: qty 50

## 2020-03-22 NOTE — Interval H&P Note (Signed)
History and Physical Interval Note:  03/22/2020 1:25 PM  [name]  has presented today for surgery, with the diagnosis of HX,OF COLON POLYPS.  The various methods of treatment have been discussed with the patient and family. After consideration of risks, benefits and other options for treatment, the patient has consented to  Procedure(s): COLONOSCOPY (N/A) as a surgical intervention.  The patient's history has been reviewed, patient examined, no change in status, stable for surgery.  I have reviewed the patient's chart and labs.  Questions were answered to the patient's satisfaction.     Alexandra Hudson  Ok to proceed with colonoscopy.

## 2020-03-22 NOTE — H&P (Signed)
Outpatient short stay form Pre-procedure 03/22/2020 1:22 PM Alexandra Miyamoto MD, MPH  Primary Physician: Dr. Ginette Pitman  Reason for visit:  Surveillance  History of present illness:   69 y/o lady with morbid obesity here for surveillance colonoscopy. Had normal colonoscopy in 2016. No records of prior colonoscopy. History of hysterectomy and cholecystectomy. No family history of GI malignancies. No blood thinners.    Current Facility-Administered Medications:  .  0.9 %  sodium chloride infusion, , Intravenous, Continuous, Jameshia Hayashida, Hilton Cork, MD, Last Rate: 20 mL/hr at 03/22/20 1253, New Bag at 03/22/20 1253  Medications Prior to Admission  Medication Sig Dispense Refill Last Dose  . aspirin 81 MG tablet Take 81 mg by mouth daily.   03/21/2020 at Unknown time  . cholecalciferol (VITAMIN D) 1000 UNITS tablet Take 1,000 Units by mouth daily.   Past Week at Unknown time  . gabapentin (NEURONTIN) 300 MG capsule Take 300-600 mg by mouth 2 (two) times daily. 1 cap in the morning and 2 cap at night   Past Week at Unknown time  . glipiZIDE (GLUCOTROL) 10 MG tablet Take 10 mg by mouth 2 (two) times daily.   Past Week at Unknown time  . loratadine-pseudoephedrine (CLARITIN-D 24 HOUR) 10-240 MG per 24 hr tablet Take 1 tablet by mouth at bedtime.   Past Week at Unknown time  . metFORMIN (GLUCOPHAGE-XR) 500 MG 24 hr tablet Take 1,000-1,500 mg by mouth 2 (two) times daily. 2 tabs in the morning and 3 tabs at bedtime   Past Week at Unknown time  . metoprolol succinate (TOPROL-XL) 50 MG 24 hr tablet Take 50 mg by mouth at bedtime. Take with or immediately following a meal.   Past Week at Unknown time  . omega-3 acid ethyl esters (LOVAZA) 1 G capsule Take 1 g by mouth 2 (two) times daily.   Past Week at Unknown time  . pioglitazone (ACTOS) 30 MG tablet Take 30 mg by mouth daily.   Past Week at Unknown time  . ramipril (ALTACE) 10 MG capsule Take 10 mg by mouth daily.   Past Week at Unknown time  . rosuvastatin  (CRESTOR) 10 MG tablet Take 20 mg by mouth daily.    Past Week at Unknown time  . clopidogrel (PLAVIX) 75 MG tablet Take 75 mg by mouth daily. @2130  (Patient not taking: Reported on 03/22/2020)   Not Taking at Unknown time  . polyethylene glycol-electrolytes (NULYTELY/GOLYTELY) 420 G solution TAKE AS DIRECTED PER COLONOSCOPY PREP.  0      No Known Allergies   Past Medical History:  Diagnosis Date  . Coronary artery disease   . Diabetes mellitus without complication ([place])    type 2  . Esophageal dilatation   . Hypercholesteremia   . Hypertension   . Microalbuminuria   . Peripheral neuropathy   . Postmenopausal bleeding     Review of systems:  Otherwise negative.    Physical Exam  Gen: Alert, oriented. Appears stated age.  HEENT: [place]/AT. PERRLA. Lungs: No respiratory distress Abd: soft, benign, no masses. Ext: No edema.    Planned procedures: Proceed with colonoscopy. The patient understands the nature of the planned procedure, indications, risks, alternatives and potential complications including but not limited to bleeding, infection, perforation, damage to internal organs and possible oversedation/side effects from anesthesia. The patient agrees and gives consent to proceed.  Please refer to procedure notes for findings, recommendations and patient disposition/instructions.     Alexandra Miyamoto MD, MPH Gastroenterology 03/22/2020  1:22 PM

## 2020-03-22 NOTE — Transfer of Care (Signed)
Immediate Anesthesia Transfer of Care Note  Patient: GIANINA OLINDE  Procedure(s) Performed: COLONOSCOPY (N/A )  Patient Location: PACU  Anesthesia Type:MAC  Level of Consciousness: sedated  Airway & Oxygen Therapy: Patient Spontanous Breathing  Post-op Assessment: Report given to RN and Post -op Vital signs reviewed and stable  Post vital signs: Reviewed and stable  Last Vitals:  Vitals Value Taken Time  BP 133/47 03/22/20 1420  Temp 35.9 C 03/22/20 1420  Pulse 87 03/22/20 1420  Resp 15 03/22/20 1420  SpO2 100 % 03/22/20 1420    Last Pain:  Vitals:   03/22/20 1420  TempSrc:   PainSc: 0-No pain         Complications: No complications documented.

## 2020-03-22 NOTE — Anesthesia Preprocedure Evaluation (Signed)
Anesthesia Evaluation  Patient identified by MRN, date of birth, ID band Patient awake    Reviewed: Allergy & Precautions, H&P , NPO status , Patient's Chart, lab work & pertinent test results, reviewed documented beta blocker date and time   Airway Mallampati: II   Neck ROM: full    Dental  (+) Poor Dentition   Pulmonary neg pulmonary ROS,    Pulmonary exam normal        Cardiovascular Exercise Tolerance: Good hypertension, On Medications + CAD  Normal cardiovascular exam Rhythm:regular Rate:Normal     Neuro/Psych  Neuromuscular disease negative psych ROS   GI/Hepatic negative GI ROS, Neg liver ROS,   Endo/Other  negative endocrine ROSdiabetes  Renal/GU negative Renal ROS  negative genitourinary   Musculoskeletal   Abdominal   Peds  Hematology negative hematology ROS (+)   Anesthesia Other Findings Past Medical History: No date: Coronary artery disease No date: Diabetes mellitus without complication (HCC)     Comment:  type 2 No date: Esophageal dilatation No date: Hypercholesteremia No date: Hypertension No date: Microalbuminuria No date: Peripheral neuropathy No date: Postmenopausal bleeding Past Surgical History: 2005:  bladder tack 2012: ABDOMINAL HYSTERECTOMY     Comment:  oophorectomy 04/15/2004: CHOLECYSTECTOMY 01/14/2015: COLONOSCOPY; N/A     Comment:  Procedure: COLONOSCOPY;  Surgeon: Hulen Luster, MD;                Location: [facility] ENDOSCOPY;  Service: Gastroenterology;                Laterality: N/A; 2006: CORONARY ANGIOPLASTY WITH STENT PLACEMENT 10/14/2007: ESOPHAGOGASTRODUODENOSCOPY     Comment:  Dr. Allen Norris No date: ganglion cyst; Right No date: TONSILLECTOMY 02/21/2018: TOOTH EXTRACTION     Comment:  4 teeth removed, 3 on bottom and 1 on top No date: TUBAL LIGATION 09/09/2018: WISDOM TOOTH EXTRACTION BMI    Body Mass Index: 46.16 kg/m     Reproductive/Obstetrics negative OB ROS                              Anesthesia Physical Anesthesia Plan  ASA: III  Anesthesia Plan: General   Post-op Pain Management:    Induction:   PONV Risk Score and Plan:   Airway Management Planned:   Additional Equipment:   Intra-op Plan:   Post-operative Plan:   Informed Consent: I have reviewed the patients History and Physical, chart, labs and discussed the procedure including the risks, benefits and alternatives for the proposed anesthesia with the patient or authorized representative who has indicated his/her understanding and acceptance.     Dental Advisory Given  Plan Discussed with: CRNA  Anesthesia Plan Comments:         Anesthesia Quick Evaluation

## 2020-03-22 NOTE — Op Note (Signed)
[facility]astroenterology Patient Name: Alexandra Hudson Procedure Date: 03/22/2020 1:37 PM MRN: [id number] Account #: [id number] Date of Birth: Aug 30, 1950 Admit Type: Outpatient Age: 69 Room: [facility] ENDO ROOM 2 Gender: Female Note Status: Finalized Procedure:             Colonoscopy Indications:           High risk colon cancer surveillance: Personal history                         of colonic polyps Providers:             Andrey Farmer MD, MD Medicines:             Monitored Anesthesia Care Complications:         No immediate complications. Procedure:             Pre-Anesthesia Assessment:                        - Prior to the procedure, a History and Physical was                         performed, and patient medications and allergies were                         reviewed. The patient is competent. The risks and                         benefits of the procedure and the sedation options and                         risks were discussed with the patient. All questions                         were answered and informed consent was obtained.                         Patient identification and proposed procedure were                         verified by the physician, the nurse, the anesthetist                         and the technician in the endoscopy suite. Mental                         Status Examination: alert and oriented. Airway                         Examination: normal oropharyngeal airway and neck                         mobility. Respiratory Examination: clear to                         auscultation. CV Examination: normal. Prophylactic                         Antibiotics: The patient does not require prophylactic  antibiotics. Prior Anticoagulants: The patient has                         taken no previous anticoagulant or antiplatelet                         agents. ASA Grade Assessment: III - A patient with                          severe systemic disease. After reviewing the risks and                         benefits, the patient was deemed in satisfactory                         condition to undergo the procedure. The anesthesia                         plan was to use monitored anesthesia care (MAC).                         Immediately prior to administration of medications,                         the patient was re-assessed for adequacy to receive                         sedatives. The heart rate, respiratory rate, oxygen                         saturations, blood pressure, adequacy of pulmonary                         ventilation, and response to care were monitored                         throughout the procedure. The physical status of the                         patient was re-assessed after the procedure.                        After obtaining informed consent, the colonoscope was                         passed under direct vision. Throughout the procedure,                         the patient's blood pressure, pulse, and oxygen                         saturations were monitored continuously. The                         Colonoscope was introduced through the anus and                         advanced to the the cecum, identified by appendiceal  orifice and ileocecal valve. The colonoscopy was                         technically difficult and complex due to restricted                         mobility of the colon and a tortuous colon. Successful                         completion of the procedure was aided by applying                         abdominal pressure. The patient tolerated the                         procedure well. The quality of the bowel preparation                         was good. Findings:      The colon (entire examined portion) appeared normal. There is a fixed       segment in the sigmoid making completion difficult.      No additional abnormalities were found on  retroflexion. Impression:            - The entire examined colon is normal.                        - No specimens collected. Recommendation:        - Discharge patient to home.                        - Resume previous diet.                        - Continue present medications.                        - Repeat colonoscopy in 5 years for surveillance.                         Depending on prior adenomatous polyp history, can                         consider extending this interval out.                        - Return to referring physician as previously                         scheduled. Procedure Code(s):     --- Professional ---                        V4259, Colorectal cancer screening; colonoscopy on                         individual at high risk Diagnosis Code(s):     --- Professional ---                        Z86.010, Personal history of colonic polyps CPT copyright 2019  American Medical Association. All rights reserved. The codes documented in this report are preliminary and upon coder review may  be revised to meet current compliance requirements. Andrey Farmer, MD Andrey Farmer MD, MD 03/22/2020 2:20:53 PM Number of Addenda: 0 Note Initiated On: 03/22/2020 1:37 PM Scope Withdrawal Time: 0 hours 8 minutes 39 seconds  Total Procedure Duration: 0 hours 27 minutes 41 seconds  Estimated Blood Loss:  Estimated blood loss: none.      [facility]

## 2020-03-24 ENCOUNTER — Encounter: Payer: Self-pay | Admitting: Gastroenterology

## 2020-03-25 NOTE — Anesthesia Postprocedure Evaluation (Signed)
Anesthesia Post Note  Patient: Alexandra Hudson  Procedure(s) Performed: COLONOSCOPY (N/A )  Patient location during evaluation: PACU Anesthesia Type: General Level of consciousness: awake and alert Pain management: pain level controlled Vital Signs Assessment: post-procedure vital signs reviewed and stable Respiratory status: spontaneous breathing, nonlabored ventilation, respiratory function stable and patient connected to nasal cannula oxygen Cardiovascular status: blood pressure returned to baseline and stable Postop Assessment: no apparent nausea or vomiting Anesthetic complications: no   No complications documented.   Last Vitals:  Vitals:   03/22/20 1432 03/22/20 1440  BP: (!) 132/59 (!) 131/58  Pulse: 77 77  Resp: 16 18  Temp:    SpO2:      Last Pain:  Vitals:   03/22/20 1440  TempSrc:   PainSc: 0-No pain                 Molli Barrows

## 2020-10-29 ENCOUNTER — Other Ambulatory Visit: Payer: Self-pay | Admitting: Internal Medicine

## 2020-10-29 DIAGNOSIS — Z1231 Encounter for screening mammogram for malignant neoplasm of breast: Secondary | ICD-10-CM

## 2020-11-10 ENCOUNTER — Other Ambulatory Visit: Payer: Self-pay

## 2020-11-10 ENCOUNTER — Ambulatory Visit
Admission: RE | Admit: 2020-11-10 | Discharge: 2020-11-10 | Disposition: A | Discharge status: Home / Self Care | Payer: BC Managed Care – PPO | Source: Ambulatory Visit | Attending: Internal Medicine | Admitting: Internal Medicine

## 2020-11-10 DIAGNOSIS — Z1231 Encounter for screening mammogram for malignant neoplasm of breast: Secondary | ICD-10-CM | POA: Diagnosis not present

## 2020-11-25 DIAGNOSIS — R0989 Other specified symptoms and signs involving the circulatory and respiratory systems: Secondary | ICD-10-CM | POA: Insufficient documentation

## 2020-12-15 ENCOUNTER — Telehealth (INDEPENDENT_AMBULATORY_CARE_PROVIDER_SITE_OTHER): Payer: Self-pay

## 2020-12-20 NOTE — Telephone Encounter (Signed)
Documentation only.

## 2021-01-06 ENCOUNTER — Other Ambulatory Visit (INDEPENDENT_AMBULATORY_CARE_PROVIDER_SITE_OTHER): Payer: Self-pay | Admitting: Vascular Surgery

## 2021-01-06 DIAGNOSIS — R0989 Other specified symptoms and signs involving the circulatory and respiratory systems: Secondary | ICD-10-CM

## 2021-01-06 DIAGNOSIS — I6523 Occlusion and stenosis of bilateral carotid arteries: Secondary | ICD-10-CM

## 2021-01-10 ENCOUNTER — Ambulatory Visit (INDEPENDENT_AMBULATORY_CARE_PROVIDER_SITE_OTHER): Payer: BC Managed Care – PPO

## 2021-01-10 ENCOUNTER — Ambulatory Visit (INDEPENDENT_AMBULATORY_CARE_PROVIDER_SITE_OTHER): Payer: BC Managed Care – PPO | Admitting: Vascular Surgery

## 2021-01-10 ENCOUNTER — Other Ambulatory Visit: Payer: Self-pay

## 2021-01-10 VITALS — BP 108/69 | HR 23 | Ht 66.0 in | Wt 276.0 lb

## 2021-01-10 DIAGNOSIS — I2510 Atherosclerotic heart disease of native coronary artery without angina pectoris: Secondary | ICD-10-CM | POA: Insufficient documentation

## 2021-01-10 DIAGNOSIS — E782 Mixed hyperlipidemia: Secondary | ICD-10-CM

## 2021-01-10 DIAGNOSIS — I25118 Atherosclerotic heart disease of native coronary artery with other forms of angina pectoris: Secondary | ICD-10-CM

## 2021-01-10 DIAGNOSIS — I6523 Occlusion and stenosis of bilateral carotid arteries: Secondary | ICD-10-CM | POA: Diagnosis not present

## 2021-01-10 DIAGNOSIS — R0989 Other specified symptoms and signs involving the circulatory and respiratory systems: Secondary | ICD-10-CM | POA: Diagnosis not present

## 2021-01-10 DIAGNOSIS — I10 Essential (primary) hypertension: Secondary | ICD-10-CM

## 2021-01-13 ENCOUNTER — Encounter (INDEPENDENT_AMBULATORY_CARE_PROVIDER_SITE_OTHER): Payer: Self-pay | Admitting: Vascular Surgery

## 2021-01-13 DIAGNOSIS — I6529 Occlusion and stenosis of unspecified carotid artery: Secondary | ICD-10-CM | POA: Insufficient documentation

## 2021-01-13 NOTE — Progress Notes (Signed)
MRN : [id number]  Alexandra Hudson is a 70 y.o. ([birth year]) female who presents with chief complaint of  Chief Complaint  Patient presents with   New Patient (Initial Visit)    NP  hande right carotid  bruit. Carotid.   Carotid  .  History of Present Illness: The patient is seen for evaluation of carotid stenosis. The carotid stenosis was identified after auscultating a bruit.  The patient denies amaurosis fugax. There is no recent history of TIA symptoms or focal motor deficits. There is no prior documented CVA.  There is no history of migraine headaches. There is no history of seizures.  The patient is taking enteric-coated aspirin 81 mg daily.  The patient has a history of coronary artery disease, no recent episodes of angina or shortness of breath. The patient denies PAD or claudication symptoms. There is a history of hyperlipidemia which is being treated with a statin.    Current Meds  Medication Sig   aspirin 81 MG chewable tablet Chew by mouth.   aspirin 81 MG tablet Take 81 mg by mouth daily.   cholecalciferol (VITAMIN D) 1000 UNITS tablet Take 1,000 Units by mouth daily.   Cholecalciferol 25 MCG (1000 UT) capsule Take by mouth.   clopidogrel (PLAVIX) 75 MG tablet Take 75 mg by mouth daily. @2130    gabapentin (NEURONTIN) 300 MG capsule Take 300-600 mg by mouth 2 (two) times daily. 1 cap in the morning and 2 cap at night   gabapentin (NEURONTIN) 300 MG capsule Take by mouth.   glipiZIDE (GLUCOTROL) 10 MG tablet Take 10 mg by mouth 2 (two) times daily.   glucose blood (PRECISION QID TEST) test strip Use 3 (three) times daily. Use as instructed. One Touch Ultra   hydrochlorothiazide (HYDRODIURIL) 25 MG tablet Take by mouth.   iron polysaccharides (NIFEREX) 150 MG capsule Take by mouth.   iron polysaccharides (NIFEREX) 150 MG capsule Take 150 mg by mouth daily.   levocetirizine (XYZAL) 5 MG tablet Take by mouth.   loratadine-pseudoephedrine (CLARITIN-D 24-HOUR) 10-240  MG 24 hr tablet Take 1 tablet by mouth at bedtime.   metFORMIN (GLUCOPHAGE-XR) 500 MG 24 hr tablet Take 1,000-1,500 mg by mouth 2 (two) times daily. 2 tabs in the morning and 3 tabs at bedtime   metoprolol succinate (TOPROL-XL) 50 MG 24 hr tablet Take 50 mg by mouth at bedtime. Take with or immediately following a meal.   mometasone (NASONEX) 50 MCG/ACT nasal spray SPRAY 2 SPRAYS INTO EACH NOSTRIL EVERY DAY   montelukast (SINGULAIR) 10 MG tablet Take by mouth.   omega-3 acid ethyl esters (LOVAZA) 1 G capsule Take 1 g by mouth 2 (two) times daily.   Omega-3 Fatty Acids (FISH OIL) 1000 MG CAPS Take 2 capsules by mouth 2 (two) times daily.   pioglitazone (ACTOS) 30 MG tablet Take 30 mg by mouth daily.   polyethylene glycol-electrolytes (NULYTELY/GOLYTELY) 420 G solution TAKE AS DIRECTED PER COLONOSCOPY PREP.   ramipril (ALTACE) 10 MG capsule Take 10 mg by mouth daily.   rosuvastatin (CRESTOR) 10 MG tablet Take 20 mg by mouth daily.    Semaglutide,0.25 or 0.5MG /DOS, 2 MG/1.5ML SOPN Inject into the skin.   sodium fluoride (FLUORISHIELD) 1.1 % GEL dental gel BRUSH WITH PASTE 2-3 TIMES A DAY. DO NOT RINSE   valACYclovir (VALTREX) 1000 MG tablet Take by mouth.    Past Medical History:  Diagnosis Date   Coronary artery disease    Diabetes mellitus without complication ([place])  type 2   Esophageal dilatation    Hypercholesteremia    Hypertension    Microalbuminuria    Peripheral neuropathy    Postmenopausal bleeding     Past Surgical History:  Procedure Laterality Date    bladder tack  2005   ABDOMINAL HYSTERECTOMY  2012   oophorectomy   CHOLECYSTECTOMY  04/15/2004   COLONOSCOPY N/A 01/14/2015   Procedure: COLONOSCOPY;  Surgeon: Hulen Luster, MD;  Location: [facility] ENDOSCOPY;  Service: Gastroenterology;  Laterality: N/A;   COLONOSCOPY N/A 03/22/2020   Procedure: COLONOSCOPY;  Surgeon: Lesly Rubenstein, MD;  Location: [facility] ENDOSCOPY;  Service: Endoscopy;  Laterality: N/A;   CORONARY  ANGIOPLASTY WITH STENT PLACEMENT  2006   ESOPHAGOGASTRODUODENOSCOPY  10/14/2007   Dr. Allen Norris   ganglion cyst Right    TONSILLECTOMY     TOOTH EXTRACTION  02/21/2018   4 teeth removed, 3 on bottom and 1 on top   [place] EXTRACTION  09/09/2018    Social History Social History   Tobacco Use   Smoking status: Never   Smokeless tobacco: Never  Substance Use Topics   Alcohol use: Not Currently   Drug use: Never    Family History Family History  Problem Relation Age of Onset   Breast cancer Maternal Grandmother   No family history of bleeding/clotting disorders, porphyria or autoimmune disease   No Known Allergies   REVIEW OF SYSTEMS (Negative unless checked)  Constitutional: [] Weight loss  [] Fever  [] Chills Cardiac: [] Chest pain   [] Chest pressure   [] Palpitations   [] Shortness of breath when laying flat   [] Shortness of breath with exertion. Vascular:  [] Pain in legs with walking   [] Pain in legs at rest  [] History of DVT   [] Phlebitis   [] Swelling in legs   [] Varicose veins   [] Non-healing ulcers Pulmonary:   [] Uses home oxygen   [] Productive cough   [] Hemoptysis   [] Wheeze  [] COPD   [] Asthma Neurologic:  [] Dizziness   [] Seizures   [] History of stroke   [] History of TIA  [] Aphasia   [] Vissual changes   [] Weakness or numbness in arm   [] Weakness or numbness in leg Musculoskeletal:   [] Joint swelling   [] Joint pain   [] Low back pain Hematologic:  [] Easy bruising  [] Easy bleeding   [] Hypercoagulable state   [] Anemic Gastrointestinal:  [] Diarrhea   [] Vomiting  [] Gastroesophageal reflux/heartburn   [] Difficulty swallowing. Genitourinary:  [] Chronic kidney disease   [] Difficult urination  [] Frequent urination   [] Blood in urine Skin:  [] Rashes   [] Ulcers  Psychological:  [] History of anxiety   []  History of major depression.  Physical Examination  Vitals:   01/10/21 1534  BP: 108/69  Pulse: (!) 23  Weight: 276 lb (125.2 kg)  Height: 5\' 6"  (1.676 m)    Body mass index is 44.55 kg/m. Gen: WD/WN, NAD Head: [place]/AT, No temporalis wasting.  Ear/Nose/Throat: Hearing grossly intact, nares w/o erythema or drainage, poor dentition Eyes: PER, EOMI, sclera nonicteric.  Neck: Supple, no masses.  No bruit or JVD.  Pulmonary:  Good air movement, clear to auscultation bilaterally, no use of accessory muscles.  Cardiac: RRR, normal S1, S2, no Murmurs. Vascular: Bilateral carotid bruit Vessel Right Left  Radial Palpable Palpable  Carotid Palpable Palpable  Gastrointestinal: soft, non-distended. No guarding/no peritoneal signs.  Musculoskeletal: M/S 5/5 throughout.  No deformity or atrophy.  Neurologic: CN 2-12 intact. Pain and light touch intact in extremities.  Symmetrical.  Speech is fluent. Motor exam  as listed above. Psychiatric: Judgment intact, Mood & affect appropriate for pt's clinical situation. Dermatologic: No rashes or ulcers noted.  No changes consistent with cellulitis. Lymph : No lichenification or skin changes of chronic lymphedema.  CBC Lab Results  Component Value Date   WBC 9.1 09/03/2014   HGB 12.0 09/03/2014   HCT 36.9 09/03/2014   MCV 86 09/03/2014   PLT 172 09/03/2014    BMET    Component Value Date/Time   NA 136 09/03/2014 2240   K 4.1 09/03/2014 2240   CL 103 09/03/2014 2240   CO2 24 09/03/2014 2240   GLUCOSE 202 (H) 09/03/2014 2240   BUN 17 09/03/2014 2240   CREATININE 1.11 09/03/2014 2240   CALCIUM 8.8 09/03/2014 2240   GFRNONAA 53 (L) 09/03/2014 2240   GFRAA >60 09/03/2014 2240   CrCl cannot be calculated (Patient's most recent lab result is older than the maximum 21 days allowed.).  COAG No results found for: INR, PROTIME  Radiology VAS US CAROTID  Result Date: 01/10/2021 Carotid Arterial Duplex Study Patient Name:  SHONDRIKA HOQUE Crum  Date of Exam:   01/10/2021 Medical Rec #: [id number]          Accession #:    [id number] Date of Birth: [birth year]         Patient Gender: F Patient Age:   69Y Exam  Location:  [facility] Procedure:      VAS US CAROTID Referring Phys: [id number] [place] --------------------------------------------------------------------------------  Indications: Bilateral bruits. Performing Technologist: Charlane Ferretti RT (R)(VS)  Examination Guidelines: A complete evaluation includes B-mode imaging, spectral Doppler, color Doppler, and power Doppler as needed of all accessible portions of each vessel. Bilateral testing is considered an integral part of a complete examination. Limited examinations for reoccurring indications may be performed as noted.  Right Carotid Findings: +----------+--------+--------+--------+------------------+--------------------+           PSV cm/sEDV cm/sStenosisPlaque DescriptionComments             +----------+--------+--------+--------+------------------+--------------------+ CCA Prox  80      13                                intimal thickening   +----------+--------+--------+--------+------------------+--------------------+ CCA Mid   79      10                                intimal thickening   +----------+--------+--------+--------+------------------+--------------------+ CCA Distal68      13                                                     +----------+--------+--------+--------+------------------+--------------------+ ICA Prox  71      16                                ICA/CCA ratio = 1.65 +----------+--------+--------+--------+------------------+--------------------+ ICA Mid   92      20                                                     +----------+--------+--------+--------+------------------+--------------------+  ICA Distal130     27                                                     +----------+--------+--------+--------+------------------+--------------------+ ECA       106     5                                                       +----------+--------+--------+--------+------------------+--------------------+ +----------+--------+-------+-----------+-------------------+           PSV cm/sEDV cmsDescribe   Arm Pressure (mmHG) +----------+--------+-------+-----------+-------------------+ [id number]            Multiphasic                    +----------+--------+-------+-----------+-------------------+ +---------+--------+--+--------+--+---------+ VertebralPSV cm/s77EDV cm/s12Antegrade +---------+--------+--+--------+--+---------+ Left Carotid Findings: +----------+--------+--------+--------+------------------+-------------------+           PSV cm/sEDV cm/sStenosisPlaque DescriptionComments            +----------+--------+--------+--------+------------------+-------------------+ CCA Prox  121     19                                intimal thickening  +----------+--------+--------+--------+------------------+-------------------+ CCA Mid   84      17                                intimal thickening  +----------+--------+--------+--------+------------------+-------------------+ CCA Distal94      22                                intimal thickening  +----------+--------+--------+--------+------------------+-------------------+ ICA Prox  186     46              calcific          ICA/CCA ratio = 2.0 +----------+--------+--------+--------+------------------+-------------------+ ICA Mid   127     20                                                    +----------+--------+--------+--------+------------------+-------------------+ ICA Distal139     33                                                    +----------+--------+--------+--------+------------------+-------------------+ ECA       92      4                                                     +----------+--------+--------+--------+------------------+-------------------+  +----------+--------+--------+-----------+-------------------+           PSV cm/sEDV cm/sDescribe   Arm Pressure (mmHG) +----------+--------+--------+-----------+-------------------+ [id number]  Multiphasic                    +----------+--------+--------+-----------+-------------------+ +---------+--------+--+--------+--+---------+ VertebralPSV cm/s91EDV cm/s22Antegrade +---------+--------+--+--------+--+---------+ Summary: Right Carotid: Velocities in the right ICA are consistent with a 40-59%                stenosis. Left Carotid: Velocities in the left ICA are consistent with a 40-59% stenosis. Vertebrals:  Bilateral vertebral arteries demonstrate antegrade flow. Subclavians: Normal flow hemodynamics were seen in bilateral subclavian              arteries. *See table(s) above for measurements and observations.  Electronically signed by Hortencia Pilar MD on 01/10/2021 at 4:32:41 PM.    Final      Assessment/Plan 1. Bilateral carotid artery stenosis Recommend:  Given the patient's asymptomatic subcritical stenosis no further invasive testing or surgery at this time.  Duplex ultrasound shows 40-59% stenosis bilaterally.  Continue antiplatelet therapy as prescribed Continue management of CAD, HTN and Hyperlipidemia Healthy heart diet,  encouraged exercise at least 4 times per week Follow up in 6 months with duplex ultrasound and physical exam.    - VAS US CAROTID; Future  2. Benign essential hypertension Continue antihypertensive medications as already ordered, these medications have been reviewed and there are no changes at this time.   3. Coronary artery disease of native artery of native heart with stable angina pectoris (HCC) Continue cardiac and antihypertensive medications as already ordered and reviewed, no changes at this time.  Continue statin as ordered and reviewed, no changes at this time  Nitrates PRN for chest pain   4. Mixed  hyperlipidemia Continue statin as ordered and reviewed, no changes at this time     Hortencia Pilar, MD  01/13/2021 12:59 PM

## 2021-01-19 ENCOUNTER — Other Ambulatory Visit: Payer: Self-pay

## 2021-01-19 ENCOUNTER — Ambulatory Visit (INDEPENDENT_AMBULATORY_CARE_PROVIDER_SITE_OTHER): Payer: BC Managed Care – PPO | Admitting: Dermatology

## 2021-01-19 DIAGNOSIS — C44319 Basal cell carcinoma of skin of other parts of face: Secondary | ICD-10-CM

## 2021-01-19 DIAGNOSIS — D492 Neoplasm of unspecified behavior of bone, soft tissue, and skin: Secondary | ICD-10-CM

## 2021-01-19 DIAGNOSIS — I6523 Occlusion and stenosis of bilateral carotid arteries: Secondary | ICD-10-CM

## 2021-01-19 DIAGNOSIS — C4491 Basal cell carcinoma of skin, unspecified: Secondary | ICD-10-CM

## 2021-01-19 DIAGNOSIS — L578 Other skin changes due to chronic exposure to nonionizing radiation: Secondary | ICD-10-CM | POA: Diagnosis not present

## 2021-01-19 HISTORY — DX: Basal cell carcinoma of skin, unspecified: C44.91

## 2021-01-19 NOTE — Patient Instructions (Addendum)
If you have any questions or concerns for your doctor, please call our main line at [phone] and press option 4 to reach your doctor's medical assistant. If no one answers, please leave a voicemail as directed and we will return your call as soon as possible. Messages left after 4 pm will be answered the following business day.   You may also send us a message via MyChart. We typically respond to MyChart messages within 1-2 business days.  For prescription refills, please ask your pharmacy to contact our office. Our fax number is [phone].  If you have an urgent issue when the clinic is closed that cannot wait until the next business day, you can page your doctor at the number below.    Please note that while we do our best to be available for urgent issues outside of office hours, we are not available 24/7.   If you have an urgent issue and are unable to reach us, you may choose to seek medical care at your doctor's office, retail clinic, urgent care center, or emergency room.  If you have a medical emergency, please immediately call 911 or go to the emergency department.  Pager Numbers  - Dr. Kowalski: [phone]  - Dr. Moye: [phone]  - Dr. Stewart: [phone]  In the event of inclement weather, please call our main line at [phone] for an update on the status of any delays or closures.  Dermatology Medication Tips: Please keep the boxes that topical medications come in in order to help keep track of the instructions about where and how to use these. Pharmacies typically print the medication instructions only on the boxes and not directly on the medication tubes.   If your medication is too expensive, please contact our office at [phone] option 4 or send us a message through MyChart.   We are unable to tell what your co-pay for medications will be in advance as this is different depending on your insurance coverage. However, we may be able to find a substitute  medication at lower cost or fill out paperwork to get insurance to cover a needed medication.   If a prior authorization is required to get your medication covered by your insurance company, please allow us 1-2 business days to complete this process.  Drug prices often vary depending on where the prescription is filled and some pharmacies may offer cheaper prices.  The website www.goodrx.com contains coupons for medications through different pharmacies. The prices here do not account for what the cost may be with help from insurance (it may be cheaper with your insurance), but the website can give you the price if you did not use any insurance.  - You can print the associated coupon and take it with your prescription to the pharmacy.  - You may also stop by our office during regular business hours and pick up a GoodRx coupon card.  - If you need your prescription sent electronically to a different pharmacy, notify our office through [facility] MyChart or by phone at [phone] option 4.   Wound Care Instructions  Cleanse wound gently with soap and water once a day then pat dry with clean gauze. Apply a thing coat of Petrolatum (petroleum jelly, "Vaseline") over the wound (unless you have an allergy to this). We recommend that you use a new, sterile tube of Vaseline. Do not pick or remove scabs. Do not remove the yellow or white "healing tissue" from the base of the wound.  Cover the   wound with fresh, clean, nonstick gauze and secure with paper tape. You may use Band-Aids in place of gauze and tape if the would is small enough, but would recommend trimming much of the tape off as there is often too much. Sometimes Band-Aids can irritate the skin.  You should call the office for your biopsy report after 1 week if you have not already been contacted.  If you experience any problems, such as abnormal amounts of bleeding, swelling, significant bruising, significant pain, or evidence of infection,  please call the office immediately.  FOR ADULT SURGERY PATIENTS: If you need something for pain relief you may take 1 extra strength Tylenol (acetaminophen) AND 2 Ibuprofen (200mg each) together every 4 hours as needed for pain. (do not take these if you are allergic to them or if you have a reason you should not take them.) Typically, you may only need pain medication for 1 to 3 days.    

## 2021-01-19 NOTE — Progress Notes (Signed)
   New Patient Visit  Subjective  Alexandra Hudson is a 70 y.o. female who presents for the following: check spot (L para nasal, ~47yrs, hx of draining, tender prn, growing).  The following portions of the chart were reviewed this encounter and updated as appropriate:   Tobacco  Allergies  Meds  Problems  Med Hx  Surg Hx  Fam Hx      Review of Systems:  No other skin or systemic complaints except as noted in HPI or Assessment and Plan.  Objective  Well appearing patient in no apparent distress; mood and affect are within normal limits.  A focused examination was performed including face. Relevant physical exam findings are noted in the Assessment and Plan.  L medial cheek Hyperkeratotic plaque 1.5 x 1.0cm          Assessment & Plan  Neoplasm of skin L medial cheek  Skin / nail biopsy Type of biopsy: tangential   Informed consent: discussed and consent obtained   Timeout: patient name, date of birth, surgical site, and procedure verified   Procedure prep:  Patient was prepped and draped in usual sterile fashion Prep type:  Isopropyl alcohol Anesthesia: the lesion was anesthetized in a standard fashion   Anesthetic:  1% lidocaine w/ epinephrine 1-100,000 buffered w/ 8.4% NaHCO3 Instrument used: flexible razor blade   Outcome: patient tolerated procedure well   Post-procedure details: sterile dressing applied and wound care instructions given   Dressing type: bandage and bacitracin    Specimen 1 - Surgical pathology Differential Diagnosis: D48.5 R/O BCC vs SCC  Check Margins: No Hyperkeratotic plaque 1.5 x 1.0cm  Actinic Damage - chronic, secondary to cumulative UV radiation exposure/sun exposure over time - diffuse scaly erythematous macules with underlying dyspigmentation - Recommend daily broad spectrum sunscreen SPF 30+ to sun-exposed areas, reapply every 2 hours as needed.  - Recommend staying in the shade or wearing long sleeves, sun glasses (UVA+UVB  protection) and wide brim hats (4-inch brim around the entire circumference of the hat). - Call for new or changing lesions.  Return for prn pending bx results.  I, Othelia Pulling, RMA, am acting as scribe for Sarina Ser, MD . Documentation: I have reviewed the above documentation for accuracy and completeness, and I agree with the above.  Sarina Ser, MD

## 2021-01-25 ENCOUNTER — Encounter: Payer: Self-pay | Admitting: Dermatology

## 2021-01-25 ENCOUNTER — Telehealth: Payer: Self-pay

## 2021-01-25 DIAGNOSIS — C44319 Basal cell carcinoma of skin of other parts of face: Secondary | ICD-10-CM

## 2021-01-25 NOTE — Telephone Encounter (Signed)
Discussed biopsy results with pt, pt would like  Mohs surgery in [facility]  Return to see Dr Raliegh Ip in January discussed

## 2021-01-25 NOTE — Telephone Encounter (Signed)
-----   Message from Ralene Bathe, MD sent at 01/25/2021  9:28 AM EDT ----- Diagnosis Skin , left medial cheek BASAL CELL CARCINOMA, NODULAR PATTERN, BASE INVOLVED  Cancer - BCC Schedule for MOHS  Make pt appt to see me in Jan or Feb 2023

## 2021-07-15 ENCOUNTER — Other Ambulatory Visit (INDEPENDENT_AMBULATORY_CARE_PROVIDER_SITE_OTHER): Payer: Self-pay | Admitting: Vascular Surgery

## 2021-07-15 DIAGNOSIS — I6523 Occlusion and stenosis of bilateral carotid arteries: Secondary | ICD-10-CM

## 2021-07-18 ENCOUNTER — Ambulatory Visit (INDEPENDENT_AMBULATORY_CARE_PROVIDER_SITE_OTHER): Payer: BC Managed Care – PPO | Admitting: Vascular Surgery

## 2021-07-18 ENCOUNTER — Other Ambulatory Visit: Payer: Self-pay

## 2021-07-18 ENCOUNTER — Ambulatory Visit (INDEPENDENT_AMBULATORY_CARE_PROVIDER_SITE_OTHER): Payer: BC Managed Care – PPO

## 2021-07-18 VITALS — BP 107/50 | HR 60 | Ht 66.0 in | Wt 272.0 lb

## 2021-07-18 DIAGNOSIS — I6523 Occlusion and stenosis of bilateral carotid arteries: Secondary | ICD-10-CM | POA: Diagnosis not present

## 2021-07-18 DIAGNOSIS — I25118 Atherosclerotic heart disease of native coronary artery with other forms of angina pectoris: Secondary | ICD-10-CM

## 2021-07-18 DIAGNOSIS — I10 Essential (primary) hypertension: Secondary | ICD-10-CM | POA: Diagnosis not present

## 2021-07-18 DIAGNOSIS — E782 Mixed hyperlipidemia: Secondary | ICD-10-CM

## 2021-07-18 DIAGNOSIS — E1142 Type 2 diabetes mellitus with diabetic polyneuropathy: Secondary | ICD-10-CM | POA: Diagnosis not present

## 2021-07-25 ENCOUNTER — Encounter (INDEPENDENT_AMBULATORY_CARE_PROVIDER_SITE_OTHER): Payer: Self-pay | Admitting: Vascular Surgery

## 2021-07-25 NOTE — Progress Notes (Signed)
MRN : [id number]  Alexandra Hudson is a 70 y.o. (1950-08-16) female who presents with chief complaint of check carotid arteries.  History of Present Illness:   The patient is seen for follow up evaluation of carotid stenosis. The carotid stenosis followed by ultrasound.   The patient denies amaurosis fugax. There is no recent history of TIA symptoms or focal motor deficits. There is no prior documented CVA.  The patient is taking enteric-coated aspirin 81 mg daily.  There is no history of migraine headaches. There is no history of seizures.  The patient has a history of coronary artery disease, no recent episodes of angina or shortness of breath. The patient denies PAD or claudication symptoms. There is a history of hyperlipidemia which is being treated with a statin.    Carotid Duplex done today shows 1-39% bilateral ICA stenosis.  No change compared to last study in 01/10/2021   Current Meds  Medication Sig   aspirin 81 MG chewable tablet Chew by mouth.   aspirin 81 MG tablet Take 81 mg by mouth daily.   cholecalciferol (VITAMIN D) 1000 UNITS tablet Take 1,000 Units by mouth daily.   Cholecalciferol 25 MCG (1000 UT) capsule Take by mouth.   clopidogrel (PLAVIX) 75 MG tablet Take 75 mg by mouth daily. @2130    gabapentin (NEURONTIN) 300 MG capsule Take 300-600 mg by mouth 2 (two) times daily. 1 cap in the morning and 2 cap at night   glipiZIDE (GLUCOTROL) 10 MG tablet Take 10 mg by mouth 2 (two) times daily.   glucose blood (PRECISION QID TEST) test strip Use 3 (three) times daily. Use as instructed. One Touch Ultra   iron polysaccharides (NIFEREX) 150 MG capsule Take by mouth.   iron polysaccharides (NIFEREX) 150 MG capsule Take 150 mg by mouth daily.   loratadine-pseudoephedrine (CLARITIN-D 24-HOUR) 10-240 MG 24 hr tablet Take 1 tablet by mouth at bedtime.   metFORMIN (GLUCOPHAGE-XR) 500 MG 24 hr tablet Take 1,000-1,500 mg by mouth 2 (two) times daily. 2 tabs in the morning  and 3 tabs at bedtime   metoprolol succinate (TOPROL-XL) 50 MG 24 hr tablet Take 50 mg by mouth at bedtime. Take with or immediately following a meal.   mometasone (NASONEX) 50 MCG/ACT nasal spray SPRAY 2 SPRAYS INTO EACH NOSTRIL EVERY DAY   montelukast (SINGULAIR) 10 MG tablet Take by mouth.   omega-3 acid ethyl esters (LOVAZA) 1 G capsule Take 1 g by mouth 2 (two) times daily.   Omega-3 Fatty Acids (FISH OIL) 1000 MG CAPS Take 2 capsules by mouth 2 (two) times daily.   pioglitazone (ACTOS) 30 MG tablet Take 30 mg by mouth daily.   polyethylene glycol-electrolytes (NULYTELY/GOLYTELY) 420 G solution TAKE AS DIRECTED PER COLONOSCOPY PREP.   ramipril (ALTACE) 10 MG capsule Take 10 mg by mouth daily.   rosuvastatin (CRESTOR) 10 MG tablet Take 20 mg by mouth daily.    Semaglutide,0.25 or 0.5MG /DOS, 2 MG/1.5ML SOPN Inject into the skin.   sodium fluoride (FLUORISHIELD) 1.1 % GEL dental gel BRUSH WITH PASTE 2-3 TIMES A DAY. DO NOT RINSE   valACYclovir (VALTREX) 1000 MG tablet Take by mouth.    Past Medical History:  Diagnosis Date   Basal cell carcinoma 01/19/2021   left medial cheek   Coronary artery disease    Diabetes mellitus without complication ([place])    type 2   Esophageal dilatation    Hypercholesteremia    Hypertension    Microalbuminuria    Peripheral  neuropathy    Postmenopausal bleeding     Past Surgical History:  Procedure Laterality Date    bladder tack  2005   ABDOMINAL HYSTERECTOMY  2012   oophorectomy   CHOLECYSTECTOMY  04/15/2004   COLONOSCOPY N/A 01/14/2015   Procedure: COLONOSCOPY;  Surgeon: Hulen Luster, MD;  Location[facility] ENDOSCOPY;  Service: Gastroenterology;  Laterality: N/A;   COLONOSCOPY N/A 03/22/2020   Procedure: COLONOSCOPY;  Surgeon: Lesly Rubenstein, MD;  Location: [facility] ENDOSCOPY;  Service: Endoscopy;  Laterality: N/A;   CORONARY ANGIOPLASTY WITH STENT PLACEMENT  2006   ESOPHAGOGASTRODUODENOSCOPY  10/14/2007   Dr. Allen Norris   ganglion cyst Right     TONSILLECTOMY     TOOTH EXTRACTION  02/21/2018   4 teeth removed, 3 on bottom and 1 on top   [place] EXTRACTION  09/09/2018    Social History Social History   Tobacco Use   Smoking status: Never   Smokeless tobacco: Never  Substance Use Topics   Alcohol use: Not Currently   Drug use: Never    Family History Family History  Problem Relation Age of Onset   Breast cancer Maternal Grandmother     No Known Allergies   REVIEW OF SYSTEMS (Negative unless checked)  Constitutional: [] Weight loss  [] Fever  [] Chills Cardiac: [] Chest pain   [] Chest pressure   [] Palpitations   [] Shortness of breath when laying flat   [] Shortness of breath with exertion. Vascular:  [] Pain in legs with walking   [] Pain in legs at rest  [] History of DVT   [] Phlebitis   [] Swelling in legs   [] Varicose veins   [] Non-healing ulcers Pulmonary:   [] Uses home oxygen   [] Productive cough   [] Hemoptysis   [] Wheeze  [] COPD   [] Asthma Neurologic:  [] Dizziness   [] Seizures   [] History of stroke   [] History of TIA  [] Aphasia   [] Vissual changes   [] Weakness or numbness in arm   [] Weakness or numbness in leg Musculoskeletal:   [] Joint swelling   [] Joint pain   [] Low back pain Hematologic:  [] Easy bruising  [] Easy bleeding   [] Hypercoagulable state   [] Anemic Gastrointestinal:  [] Diarrhea   [] Vomiting  [] Gastroesophageal reflux/heartburn   [] Difficulty swallowing. Genitourinary:  [] Chronic kidney disease   [] Difficult urination  [] Frequent urination   [] Blood in urine Skin:  [] Rashes   [] Ulcers  Psychological:  [] History of anxiety   []  History of major depression.  Physical Examination  Vitals:   07/18/21 1135  BP: (!) 107/50  Pulse: 60  Weight: 272 lb (123.4 kg)  Height: 5\' 6"  (1.676 m)   Body mass index is 43.9 kg/m. Gen: WD/WN, NAD Head: [place]/AT, No temporalis wasting.  Ear/Nose/Throat: Hearing grossly intact, nares w/o erythema or drainage Eyes: PER, EOMI, sclera nonicteric.   Neck: Supple, no masses.  No bruit or JVD.  Pulmonary:  Good air movement, no audible wheezing, no use of accessory muscles.  Cardiac: RRR, normal S1, S2, no Murmurs. Vascular:   carotid bruit Vessel Right Left  Radial Palpable Palpable  Carotid Palpable Palpable  Gastrointestinal: soft, non-distended. No guarding/no peritoneal signs.  Musculoskeletal: M/S 5/5 throughout.  No visible deformity.  Neurologic: CN 2-12 intact. Pain and light touch intact in extremities.  Symmetrical.  Speech is fluent. Motor exam as listed above. Psychiatric: Judgment intact, Mood & affect appropriate for pt's clinical situation. Dermatologic: No rashes or ulcers noted.  No changes consistent with cellulitis.   CBC Lab Results  Component Value Date  WBC 9.1 09/03/2014   HGB 12.0 09/03/2014   HCT 36.9 09/03/2014   MCV 86 09/03/2014   PLT 172 09/03/2014    BMET    Component Value Date/Time   NA 136 09/03/2014 2240   K 4.1 09/03/2014 2240   CL 103 09/03/2014 2240   CO2 24 09/03/2014 2240   GLUCOSE 202 (H) 09/03/2014 2240   BUN 17 09/03/2014 2240   CREATININE 1.11 09/03/2014 2240   CALCIUM 8.8 09/03/2014 2240   GFRNONAA 53 (L) 09/03/2014 2240   GFRAA >60 09/03/2014 2240   CrCl cannot be calculated (Patient's most recent lab result is older than the maximum 21 days allowed.).  COAG No results found for: INR, PROTIME  Radiology VAS US CAROTID  Result Date: 07/18/2021 Carotid Arterial Duplex Study Patient Name:  Alexandra Hudson [place]  Date of Exam:   07/18/2021 Medical Rec #: [id number]          Accession #:    [id number] Date of Birth: 25-Jan-1951         Patient Gender: F Patient Age:   52 years Exam Location:  [facility] Procedure:      VAS US CAROTID Referring Phys: Hortencia Pilar --------------------------------------------------------------------------------  Indications:       Carotid artery disease. Comparison Study:  01/10/2021 Performing Technologist: Concha Norway RVT   Examination Guidelines: A complete evaluation includes B-mode imaging, spectral Doppler, color Doppler, and power Doppler as needed of all accessible portions of each vessel. Bilateral testing is considered an integral part of a complete examination. Limited examinations for reoccurring indications may be performed as noted.  Right Carotid Findings: +----------+--------+--------+--------+----------------------+--------+             PSV cm/s EDV cm/s Stenosis Plaque Description     Comments  +----------+--------+--------+--------+----------------------+--------+  CCA Prox   55       12                                                 +----------+--------+--------+--------+----------------------+--------+  CCA Mid    75       16                                                 +----------+--------+--------+--------+----------------------+--------+  CCA Distal 69       14                                                 +----------+--------+--------+--------+----------------------+--------+  ICA Prox   55       15       1-39%    smooth and homogeneous           +----------+--------+--------+--------+----------------------+--------+  ICA Mid    79       23                                                 +----------+--------+--------+--------+----------------------+--------+  ICA Distal 74  21                                                 +----------+--------+--------+--------+----------------------+--------+  ECA        79       9                                                  +----------+--------+--------+--------+----------------------+--------+ +----------+--------+-------+----------------+-------------------+             PSV cm/s EDV cms Describe         Arm Pressure (mmHG)  +----------+--------+-------+----------------+-------------------+  Subclavian 95               Multiphasic, WNL                      +----------+--------+-------+----------------+-------------------+  +---------+--------+--+--------+--+---------+  Vertebral PSV cm/s 50 EDV cm/s 11 Antegrade  +---------+--------+--+--------+--+---------+  Left Carotid Findings: +----------+--------+--------+--------+------------------+--------+             PSV cm/s EDV cm/s Stenosis Plaque Description Comments  +----------+--------+--------+--------+------------------+--------+  CCA Prox   82       15                                             +----------+--------+--------+--------+------------------+--------+  CCA Mid    76       15                                             +----------+--------+--------+--------+------------------+--------+  CCA Distal 76       15                                             +----------+--------+--------+--------+------------------+--------+  ICA Prox   93       27       1-39%    calcific                     +----------+--------+--------+--------+------------------+--------+  ICA Mid    90       18                                             +----------+--------+--------+--------+------------------+--------+  ICA Distal 101      20                                             +----------+--------+--------+--------+------------------+--------+  ECA        95       5                                              +----------+--------+--------+--------+------------------+--------+ +----------+--------+--------+--------+-------------------+  PSV cm/s EDV cm/s Describe Arm Pressure (mmHG)  +----------+--------+--------+--------+-------------------+  Subclavian 156                                             +----------+--------+--------+--------+-------------------+ +---------+--------+--+--------+--+  Vertebral PSV cm/s 48 EDV cm/s 11  +---------+--------+--+--------+--+   Summary: Right Carotid: Velocities in the right ICA are consistent with a 1-39% stenosis.                Improved flow compared to previous study. Left Carotid: Velocities in the left ICA are consistent with a 1-39%  stenosis.               Improved flow compared to previous study. Vertebrals:  Bilateral vertebral arteries demonstrate antegrade flow. Subclavians: Normal flow hemodynamics were seen in bilateral subclavian              arteries. *See table(s) above for measurements and observations.  Electronically signed by Hortencia Pilar MD on 07/18/2021 at 5:07:18 PM.    Final      Assessment/Plan 1. Bilateral carotid artery stenosis Recommend:  Given the patient's asymptomatic subcritical stenosis no further invasive testing or surgery at this time.  Duplex ultrasound shows 1-39% stenosis bilaterally.  Continue antiplatelet therapy as prescribed Continue management of CAD, HTN and Hyperlipidemia Healthy heart diet,  encouraged exercise at least 4 times per week Follow up in 12 months with duplex ultrasound and physical exam   - VAS US CAROTID; Future  2. Coronary artery disease of native artery of native heart with stable angina pectoris (HCC) Continue cardiac and antihypertensive medications as already ordered and reviewed, no changes at this time.  Continue statin as ordered and reviewed, no changes at this time  Nitrates PRN for chest pain   3. Benign essential hypertension Continue antihypertensive medications as already ordered, these medications have been reviewed and there are no changes at this time.   4. Controlled type 2 diabetes mellitus with diabetic polyneuropathy, without long-term current use of insulin (HCC) Continue hypoglycemic medications as already ordered, these medications have been reviewed and there are no changes at this time.  Hgb A1C to be monitored as already arranged by primary service   5. Mixed hyperlipidemia Continue statin as ordered and reviewed, no changes at this time     Hortencia Pilar, MD  07/25/2021 3:04 PM

## 2021-08-04 ENCOUNTER — Encounter: Payer: Self-pay | Admitting: Dermatology

## 2021-08-08 ENCOUNTER — Ambulatory Visit (INDEPENDENT_AMBULATORY_CARE_PROVIDER_SITE_OTHER): Payer: BC Managed Care – PPO | Admitting: Dermatology

## 2021-08-08 ENCOUNTER — Other Ambulatory Visit: Payer: Self-pay

## 2021-08-08 DIAGNOSIS — L578 Other skin changes due to chronic exposure to nonionizing radiation: Secondary | ICD-10-CM

## 2021-08-08 DIAGNOSIS — L249 Irritant contact dermatitis, unspecified cause: Secondary | ICD-10-CM | POA: Diagnosis not present

## 2021-08-08 DIAGNOSIS — I781 Nevus, non-neoplastic: Secondary | ICD-10-CM

## 2021-08-08 DIAGNOSIS — L259 Unspecified contact dermatitis, unspecified cause: Secondary | ICD-10-CM

## 2021-08-08 DIAGNOSIS — Z85828 Personal history of other malignant neoplasm of skin: Secondary | ICD-10-CM

## 2021-08-08 DIAGNOSIS — L853 Xerosis cutis: Secondary | ICD-10-CM

## 2021-08-08 DIAGNOSIS — L821 Other seborrheic keratosis: Secondary | ICD-10-CM | POA: Diagnosis not present

## 2021-08-08 DIAGNOSIS — L719 Rosacea, unspecified: Secondary | ICD-10-CM

## 2021-08-08 NOTE — Patient Instructions (Addendum)
Gentle Skin Care Guide  1. Bathe no more than once a day.  2. Avoid bathing in hot water  3. Use a mild soap like Dove, Vanicream, Cetaphil, CeraVe. Can use Lever 2000 or Cetaphil antibacterial soap  4. Use soap only where you need it. On most days, use it under your arms, between your legs, and on your feet. Let the water rinse other areas unless visibly dirty.  5. When you get out of the bath/shower, use a towel to gently blot your skin dry, don't rub it.  6. While your skin is still a little damp, apply a moisturizing cream such as Vanicream, CeraVe, Cetaphil, Eucerin, Sarna lotion or plain Vaseline Jelly. For hands apply Neutrogena [place] Hand Cream or Excipial Hand Cream.  7. Reapply moisturizer any time you start to itch or feel dry.  8. Sometimes using free and clear laundry detergents can be helpful. Fabric softener sheets should be avoided. Downy Free & Gentle liquid, or any liquid fabric softener that is free of dyes and perfumes, it acceptable to use  9. If your doctor has given you prescription creams you may apply moisturizers over them     If You Need Anything After Your Visit  If you have any questions or concerns for your doctor, please call our main line at [phone] and press option 4 to reach your doctor's medical assistant. If no one answers, please leave a voicemail as directed and we will return your call as soon as possible. Messages left after 4 pm will be answered the following business day.   You may also send [place] a message via [place]. We typically respond to MyChart messages within 1-2 business days.  For prescription refills, please ask your pharmacy to contact our office. Our fax number is [phone].  If you have an urgent issue when the clinic is closed that cannot wait until the next business day, you can page your doctor at the number below.    Please note that while we do our best to be available for urgent issues outside of office hours, we  are not available 24/7.   If you have an urgent issue and are unable to reach [place], you may choose to seek medical care at your doctor's office, retail clinic, urgent care center, or emergency room.  If you have a medical emergency, please immediately call 911 or go to the emergency department.  Pager Numbers  - Dr. Nehemiah Massed: [phone]  - Dr. Laurence Ferrari: [phone]  - Dr. Nicole Kindred: [phone]  In the event of inclement weather, please call our main line at [phone] for an update on the status of any delays or closures.  Dermatology Medication Tips: Please keep the boxes that topical medications come in in order to help keep track of the instructions about where and how to use these. Pharmacies typically print the medication instructions only on the boxes and not directly on the medication tubes.   If your medication is too expensive, please contact our office at [phone] option 4 or send [place] a message through [place].   We are unable to tell what your co-pay for medications will be in advance as this is different depending on your insurance coverage. However, we may be able to find a substitute medication at lower cost or fill out paperwork to get insurance to cover a needed medication.   If a prior authorization is required to get your medication covered by your insurance company, please allow [place] 1-2 business days to complete  this process.  Drug prices often vary depending on where the prescription is filled and some pharmacies may offer cheaper prices.  The website www.goodrx.com contains coupons for medications through different pharmacies. The prices here do not account for what the cost may be with help from insurance (it may be cheaper with your insurance), but the website can give you the price if you did not use any insurance.  - You can print the associated coupon and take it with your prescription to the pharmacy.  - You may also stop by our office during regular business  hours and pick up a GoodRx coupon card.  - If you need your prescription sent electronically to a different pharmacy, notify our office through [facility] or by phone at [phone] option 4.     Si Usted Necesita Algo Despus de Su Visita  Tambin puede enviarnos un mensaje a travs de Pharmacist, community. Por lo general respondemos a los mensajes de MyChart en el transcurso de 1 a 2 das hbiles.  Para renovar recetas, por favor pida a su farmacia que se ponga en contacto con nuestra oficina. Harland Dingwall de fax es [place] [phone].  Si tiene un asunto urgente cuando la clnica est cerrada y que no puede esperar hasta el siguiente da hbil, puede llamar/localizar a su doctor(a) al nmero que aparece a continuacin.   Por favor, tenga en cuenta que aunque hacemos todo lo posible para estar disponibles para asuntos urgentes fuera del horario de [place], no estamos disponibles las 24 horas del da, los 7 das de la [place].   Si tiene un problema urgente y no puede comunicarse con nosotros, puede optar por buscar atencin mdica  en el consultorio de su doctor(a), en una clnica privada, en un centro de atencin urgente o en una sala de emergencias.  Si tiene Engineering geologist, por favor llame inmediatamente al 911 o vaya a la sala de emergencias.  Nmeros de bper  - Dr. Nehemiah Massed: [phone]  - Dra. Moye: [phone]  - Dra. Nicole Kindred: [phone]  En caso de inclemencias del [place], por favor llame a Johnsie Kindred principal al [phone] para una actualizacin sobre el [place] de cualquier retraso o cierre.  Consejos para la medicacin en dermatologa: Por favor, guarde las cajas en las que vienen los medicamentos de uso tpico para ayudarle a seguir las instrucciones sobre dnde y cmo usarlos. Las farmacias generalmente imprimen las instrucciones del medicamento slo en las cajas y no directamente en los tubos del [place].   Si su medicamento es muy caro, por favor,  pngase en contacto con Zigmund Daniel llamando al [phone] y presione la opcin 4 o envenos un mensaje a travs de Pharmacist, community.   No podemos decirle cul ser su copago por los medicamentos por adelantado ya que esto es diferente dependiendo de la cobertura de su seguro. Sin embargo, es posible que podamos encontrar un medicamento sustituto a Electrical engineer un formulario para que el seguro cubra el medicamento que se considera necesario.   Si se requiere una autorizacin previa para que su compaa de seguros [place] su medicamento, por favor permtanos de 1 a 2 das hbiles para completar este proceso.  Los precios de los medicamentos varan con frecuencia dependiendo del Environmental consultant de dnde se surte la receta y alguna farmacias pueden ofrecer precios ms baratos.  El sitio web www.goodrx.com tiene cupones para medicamentos de Airline pilot. Los precios aqu no tienen en cuenta lo que podra costar con la ayuda del seguro (  puede ser ms barato con su seguro), pero el sitio web puede darle el precio si no Field seismologist.  - Puede imprimir el cupn correspondiente y llevarlo con su receta a la farmacia.  - Tambin puede pasar por nuestra oficina durante el horario de atencin regular y Charity fundraiser una tarjeta de cupones de GoodRx.  - Si necesita que su receta se enve electrnicamente a una farmacia diferente, informe a nuestra oficina a travs de MyChart de [place] o por telfono llamando al [phone] y presione la opcin 4.

## 2021-08-08 NOTE — Progress Notes (Signed)
° °  Follow-Up Visit   Subjective  Alexandra Hudson is a 71 y.o. female who presents for the following: Follow-up (Patient here today for follow up on left medial cheek. Patient had mohs surgery in July of last year. Patient states it is healing well. ). The patient has spots, moles and lesions to be evaluated, some may be new or changing and the patient has concerns that these could be cancer.  The following portions of the chart were reviewed this encounter and updated as appropriate:  Tobacco   Allergies   Meds   Problems   Med Hx   Surg Hx   Fam Hx       Review of Systems: No other skin or systemic complaints except as noted in HPI or Assessment and Plan.  Objective  Well appearing patient in no apparent distress; mood and affect are within normal limits.  A focused examination was performed including face, arms. Relevant physical exam findings are noted in the Assessment and Plan.  Head - Anterior (Face) Mid face erythema with telangiectasias +/- scattered inflammatory papules.   right infranasal area Peeling and crusting at right infranasal    Assessment & Plan  Rosacea Head - Anterior (Face) Rosacea is a chronic progressive skin condition usually affecting the face of adults, causing redness and/or acne bumps. It is treatable but not curable. It sometimes affects the eyes (ocular rosacea) as well. It may respond to topical and/or systemic medication and can flare with stress, sun exposure, alcohol, exercise and some foods.  Daily application of broad spectrum spf 30+ sunscreen to face is recommended to reduce flares.  Patient defers treatment at this time  Irritant contact dermatitis with crust right infranasal area Patient reports is from having a cold and irritation from blowing nose and Kleenex tissues.   Seborrheic Keratoses face and cheeks - Stuck-on, waxy, tan-brown papules and/or plaques  - Benign-appearing - Discussed benign etiology and prognosis. - Observe -  Call for any changes  Xerosis - diffuse xerotic patches - recommend gentle, hydrating skin care - gentle skin care handout given  Telangiectasia face and cheeks - Dilated blood vessel - Benign appearing on exam - Call for changes  Actinic Damage - chronic, secondary to cumulative UV radiation exposure/sun exposure over time - diffuse scaly erythematous macules with underlying dyspigmentation - Recommend daily broad spectrum sunscreen SPF 30+ to sun-exposed areas, reapply every 2 hours as needed.  - Recommend staying in the shade or wearing long sleeves, sun glasses (UVA+UVB protection) and wide brim hats (4-inch brim around the entire circumference of the hat). - Call for new or changing lesions.  History of Basal Cell Carcinoma of the Skin - No evidence of recurrence today at left medial cheek (2022) mohs  - Recommend regular full body skin exams - Recommend daily broad spectrum sunscreen SPF 30+ to sun-exposed areas, reapply every 2 hours as needed.  - Call if any new or changing lesions are noted between office visits  Return for 6 month tbse h/o bcc . IRuthell Rummage, CMA, am acting as scribe for Sarina Ser, MD. Documentation: I have reviewed the above documentation for accuracy and completeness, and I agree with the above.  Sarina Ser, MD

## 2021-08-09 ENCOUNTER — Encounter: Payer: Self-pay | Admitting: Dermatology

## 2021-11-08 ENCOUNTER — Other Ambulatory Visit: Payer: Self-pay | Admitting: Internal Medicine

## 2021-11-08 DIAGNOSIS — Z1231 Encounter for screening mammogram for malignant neoplasm of breast: Secondary | ICD-10-CM

## 2021-11-11 ENCOUNTER — Ambulatory Visit
Admission: RE | Admit: 2021-11-11 | Discharge: 2021-11-11 | Disposition: A | Discharge status: Home / Self Care | Payer: BC Managed Care – PPO | Source: Ambulatory Visit | Attending: Internal Medicine | Admitting: Internal Medicine

## 2021-11-11 DIAGNOSIS — Z1231 Encounter for screening mammogram for malignant neoplasm of breast: Secondary | ICD-10-CM | POA: Insufficient documentation

## 2022-02-06 ENCOUNTER — Ambulatory Visit (INDEPENDENT_AMBULATORY_CARE_PROVIDER_SITE_OTHER): Payer: BC Managed Care – PPO | Admitting: Dermatology

## 2022-02-06 DIAGNOSIS — L719 Rosacea, unspecified: Secondary | ICD-10-CM

## 2022-02-06 DIAGNOSIS — Z1283 Encounter for screening for malignant neoplasm of skin: Secondary | ICD-10-CM

## 2022-02-06 DIAGNOSIS — D229 Melanocytic nevi, unspecified: Secondary | ICD-10-CM

## 2022-02-06 DIAGNOSIS — L578 Other skin changes due to chronic exposure to nonionizing radiation: Secondary | ICD-10-CM

## 2022-02-06 DIAGNOSIS — D1800 Hemangioma unspecified site: Secondary | ICD-10-CM

## 2022-02-06 DIAGNOSIS — L814 Other melanin hyperpigmentation: Secondary | ICD-10-CM

## 2022-02-06 DIAGNOSIS — Z85828 Personal history of other malignant neoplasm of skin: Secondary | ICD-10-CM

## 2022-02-06 DIAGNOSIS — L821 Other seborrheic keratosis: Secondary | ICD-10-CM

## 2022-02-06 DIAGNOSIS — D692 Other nonthrombocytopenic purpura: Secondary | ICD-10-CM

## 2022-02-06 NOTE — Progress Notes (Signed)
Follow-Up Visit   Subjective  Alexandra Hudson is a 71 y.o. female who presents for the following: Annual Exam (History of BCC - The patient presents for Total-Body Skin Exam (TBSE) for skin cancer screening and mole check.  The patient has spots, moles and lesions to be evaluated, some may be new or changing and the patient has concerns that these could be cancer./).  The following portions of the chart were reviewed this encounter and updated as appropriate:   Tobacco  Allergies  Meds  Problems  Med Hx  Surg Hx  Fam Hx     Review of Systems:  No other skin or systemic complaints except as noted in HPI or Assessment and Plan.  Objective  Well appearing patient in no apparent distress; mood and affect are within normal limits.  A full examination was performed including scalp, head, eyes, ears, nose, lips, neck, chest, axillae, abdomen, back, buttocks, bilateral upper extremities, bilateral lower extremities, hands, feet, fingers, toes, fingernails, and toenails. All findings within normal limits unless otherwise noted below.  Head - Anterior (Face) Pinkness   Assessment & Plan   Purpura - Chronic; persistent and recurrent.  Treatable, but not curable. - Violaceous macules and patches - Benign - Related to trauma, age, sun damage and/or use of blood thinners, chronic use of topical and/or oral steroids - Observe - Can use OTC arnica containing moisturizer such as Dermend Bruise Formula if desired - Call for worsening or other concerns  History of Basal Cell Carcinoma of the Skin - No evidence of recurrence today - Recommend regular full body skin exams - Recommend daily broad spectrum sunscreen SPF 30+ to sun-exposed areas, reapply every 2 hours as needed.  - Call if any new or changing lesions are noted between office visits  Lentigines - Scattered tan macules - Due to sun exposure - Benign-appearing, observe - Recommend daily broad spectrum sunscreen SPF 30+ to  sun-exposed areas, reapply every 2 hours as needed. - Call for any changes  Seborrheic Keratoses - Stuck-on, waxy, tan-brown papules and/or plaques  - Benign-appearing - Discussed benign etiology and prognosis. - Observe - Call for any changes  Melanocytic Nevi - Tan-brown and/or pink-flesh-colored symmetric macules and papules - Benign appearing on exam today - Observation - Call clinic for new or changing moles - Recommend daily use of broad spectrum spf 30+ sunscreen to sun-exposed areas.   Hemangiomas - Red papules - Discussed benign nature - Observe - Call for any changes  Actinic Damage - Chronic condition, secondary to cumulative UV/sun exposure - diffuse scaly erythematous macules with underlying dyspigmentation - Recommend daily broad spectrum sunscreen SPF 30+ to sun-exposed areas, reapply every 2 hours as needed.  - Staying in the shade or wearing long sleeves, sun glasses (UVA+UVB protection) and wide brim hats (4-inch brim around the entire circumference of the hat) are also recommended for sun protection.  - Call for new or changing lesions.  Skin cancer screening performed today.  Rosacea Head - Anterior (Face)  Rosacea is a chronic progressive skin condition usually affecting the face of adults, causing redness and/or acne bumps. It is treatable but not curable. It sometimes affects the eyes (ocular rosacea) as well. It may respond to topical and/or systemic medication and can flare with stress, sun exposure, alcohol, exercise and some foods.  Daily application of broad spectrum spf 30+ sunscreen to face is recommended to reduce flares.  Mild - no treatment at this time.  Skin cancer screening  Return in about 1 year (around 02/07/2023) for TBSE. Documentation: I have reviewed the above documentation for accuracy and completeness, and I agree with the above.  Sarina Ser, MD

## 2022-02-06 NOTE — Patient Instructions (Signed)
Due to recent changes in healthcare laws, you may see results of your pathology and/or laboratory studies on MyChart before the doctors have had a chance to review them. We understand that in some cases there may be results that are confusing or concerning to you. Please understand that not all results are received at the same time and often the doctors may need to interpret multiple results in order to provide you with the best plan of care or course of treatment. Therefore, we ask that you please give us 2 business days to thoroughly review all your results before contacting the office for clarification. Should we see a critical lab result, you will be contacted sooner.   If You Need Anything After Your Visit  If you have any questions or concerns for your doctor, please call our main line at [phone] and press option 4 to reach your doctor's medical assistant. If no one answers, please leave a voicemail as directed and we will return your call as soon as possible. Messages left after 4 pm will be answered the following business day.   You may also send us a message via MyChart. We typically respond to MyChart messages within 1-2 business days.  For prescription refills, please ask your pharmacy to contact our office. Our fax number is [phone].  If you have an urgent issue when the clinic is closed that cannot wait until the next business day, you can page your doctor at the number below.    Please note that while we do our best to be available for urgent issues outside of office hours, we are not available 24/7.   If you have an urgent issue and are unable to reach us, you may choose to seek medical care at your doctor's office, retail clinic, urgent care center, or emergency room.  If you have a medical emergency, please immediately call 911 or go to the emergency department.  Pager Numbers  - Dr. Kowalski: [phone]  - Dr. Moye: [phone]  - Dr. Stewart:  [phone]  In the event of inclement weather, please call our main line at [phone] for an update on the status of any delays or closures.  Dermatology Medication Tips: Please keep the boxes that topical medications come in in order to help keep track of the instructions about where and how to use these. Pharmacies typically print the medication instructions only on the boxes and not directly on the medication tubes.   If your medication is too expensive, please contact our office at [phone] option 4 or send us a message through MyChart.   We are unable to tell what your co-pay for medications will be in advance as this is different depending on your insurance coverage. However, we may be able to find a substitute medication at lower cost or fill out paperwork to get insurance to cover a needed medication.   If a prior authorization is required to get your medication covered by your insurance company, please allow us 1-2 business days to complete this process.  Drug prices often vary depending on where the prescription is filled and some pharmacies may offer cheaper prices.  The website www.goodrx.com contains coupons for medications through different pharmacies. The prices here do not account for what the cost may be with help from insurance (it may be cheaper with your insurance), but the website can give you the price if you did not use any insurance.  - You can print the associated coupon and take it with   your prescription to the pharmacy.  - You may also stop by our office during regular business hours and pick up a GoodRx coupon card.  - If you need your prescription sent electronically to a different pharmacy, notify our office through [facility] MyChart or by phone at [phone] option 4.     Si Usted Necesita Algo Despus de Su Visita  Tambin puede enviarnos un mensaje a travs de MyChart. Por lo general respondemos a los mensajes de MyChart en el transcurso de 1 a 2  das hbiles.  Para renovar recetas, por favor pida a su farmacia que se ponga en contacto con nuestra oficina. Nuestro nmero de fax es el [phone].  Si tiene un asunto urgente cuando la clnica est cerrada y que no puede esperar hasta el siguiente da hbil, puede llamar/localizar a su doctor(a) al nmero que aparece a continuacin.   Por favor, tenga en cuenta que aunque hacemos todo lo posible para estar disponibles para asuntos urgentes fuera del horario de oficina, no estamos disponibles las 24 horas del da, los 7 das de la semana.   Si tiene un problema urgente y no puede comunicarse con nosotros, puede optar por buscar atencin mdica  en el consultorio de su doctor(a), en una clnica privada, en un centro de atencin urgente o en una sala de emergencias.  Si tiene una emergencia mdica, por favor llame inmediatamente al 911 o vaya a la sala de emergencias.  Nmeros de bper  - Dr. Kowalski: [phone]  - Dra. Moye: [phone]  - Dra. Stewart: [phone]  En caso de inclemencias del tiempo, por favor llame a nuestra lnea principal al [phone] para una actualizacin sobre el estado de cualquier retraso o cierre.  Consejos para la medicacin en dermatologa: Por favor, guarde las cajas en las que vienen los medicamentos de uso tpico para ayudarle a seguir las instrucciones sobre dnde y cmo usarlos. Las farmacias generalmente imprimen las instrucciones del medicamento slo en las cajas y no directamente en los tubos del medicamento.   Si su medicamento es muy caro, por favor, pngase en contacto con nuestra oficina llamando al [phone] y presione la opcin 4 o envenos un mensaje a travs de MyChart.   No podemos decirle cul ser su copago por los medicamentos por adelantado ya que esto es diferente dependiendo de la cobertura de su seguro. Sin embargo, es posible que podamos encontrar un medicamento sustituto a menor costo o llenar un formulario para que el  seguro cubra el medicamento que se considera necesario.   Si se requiere una autorizacin previa para que su compaa de seguros cubra su medicamento, por favor permtanos de 1 a 2 das hbiles para completar este proceso.  Los precios de los medicamentos varan con frecuencia dependiendo del lugar de dnde se surte la receta y alguna farmacias pueden ofrecer precios ms baratos.  El sitio web www.goodrx.com tiene cupones para medicamentos de diferentes farmacias. Los precios aqu no tienen en cuenta lo que podra costar con la ayuda del seguro (puede ser ms barato con su seguro), pero el sitio web puede darle el precio si no utiliz ningn seguro.  - Puede imprimir el cupn correspondiente y llevarlo con su receta a la farmacia.  - Tambin puede pasar por nuestra oficina durante el horario de atencin regular y recoger una tarjeta de cupones de GoodRx.  - Si necesita que su receta se enve electrnicamente a una farmacia diferente, informe a nuestra oficina a travs de MyChart de [facility]   o por telfono llamando al [phone] y presione la opcin 4.  

## 2022-02-14 ENCOUNTER — Encounter: Payer: Self-pay | Admitting: Dermatology

## 2022-07-16 NOTE — Progress Notes (Unsigned)
MRN : [id number]  Alexandra Hudson is a 71 y.o. (1950/08/04) female who presents with chief complaint of check carotid arteries.  History of Present Illness:   The patient is seen for follow up evaluation of carotid stenosis. The carotid stenosis followed by ultrasound.    The patient denies amaurosis fugax. There is no recent history of TIA symptoms or focal motor deficits. There is no prior documented CVA.   The patient is taking enteric-coated aspirin 81 mg daily.   There is no history of migraine headaches. There is no history of seizures.   The patient has a history of coronary artery disease, no recent episodes of angina or shortness of breath. The patient denies PAD or claudication symptoms. There is a history of hyperlipidemia which is being treated with a statin.     Carotid Duplex done today shows 1-39% bilateral ICA stenosis.  No change compared to last study in 01/10/2021     No outpatient medications have been marked as taking for the 07/17/22 encounter (Appointment) with Delana Meyer, Dolores Lory, MD.    Past Medical History:  Diagnosis Date   Basal cell carcinoma 01/19/2021   left medial cheek. nodular pattern Mohs 02/07/2021   Coronary artery disease    Diabetes mellitus without complication ([place])    type 2   Esophageal dilatation    Hypercholesteremia    Hypertension    Microalbuminuria    Peripheral neuropathy    Postmenopausal bleeding     Past Surgical History:  Procedure Laterality Date    bladder tack  2005   ABDOMINAL HYSTERECTOMY  2012   oophorectomy   CHOLECYSTECTOMY  04/15/2004   COLONOSCOPY N/A 01/14/2015   Procedure: COLONOSCOPY;  Surgeon: Hulen Luster, MD;  Location: [facility] ENDOSCOPY;  Service: Gastroenterology;  Laterality: N/A;   COLONOSCOPY N/A 03/22/2020   Procedure: COLONOSCOPY;  Surgeon: Lesly Rubenstein, MD;  Location[facility] ENDOSCOPY;  Service: Endoscopy;  Laterality: N/A;   CORONARY ANGIOPLASTY WITH STENT PLACEMENT  2006    ESOPHAGOGASTRODUODENOSCOPY  10/14/2007   Dr. Allen Norris   ganglion cyst Right    TONSILLECTOMY     TOOTH EXTRACTION  02/21/2018   4 teeth removed, 3 on bottom and 1 on top   [place] EXTRACTION  09/09/2018    Social History Social History   Tobacco Use   Smoking status: Never   Smokeless tobacco: Never  Substance Use Topics   Alcohol use: Not Currently   Drug use: Never    Family History Family History  Problem Relation Age of Onset   Breast cancer Maternal Grandmother     No Known Allergies   REVIEW OF SYSTEMS (Negative unless checked)  Constitutional: '[]'$ Weight loss  '[]'$ Fever  '[]'$ Chills Cardiac: '[]'$ Chest pain   '[]'$ Chest pressure   '[]'$ Palpitations   '[]'$ Shortness of breath when laying flat   '[]'$ Shortness of breath with exertion. Vascular:  '[x]'$ Pain in legs with walking   '[]'$ Pain in legs at rest  '[]'$ History of DVT   '[]'$ Phlebitis   '[]'$ Swelling in legs   '[]'$ Varicose veins   '[]'$ Non-healing ulcers Pulmonary:   '[]'$ Uses home oxygen   '[]'$ Productive cough   '[]'$ Hemoptysis   '[]'$ Wheeze  '[]'$ COPD   '[]'$ Asthma Neurologic:  '[]'$ Dizziness   '[]'$ Seizures   '[]'$ History of stroke   '[]'$ History of TIA  '[]'$ Aphasia   '[]'$ Vissual changes   '[]'$ Weakness or numbness in arm   '[]'$ Weakness or numbness in leg Musculoskeletal:   '[]'$ Joint swelling   '[]'$ Joint pain   '[]'$   Low back pain Hematologic:  '[]'$ Easy bruising  '[]'$ Easy bleeding   '[]'$ Hypercoagulable state   '[]'$ Anemic Gastrointestinal:  '[]'$ Diarrhea   '[]'$ Vomiting  '[]'$ Gastroesophageal reflux/heartburn   '[]'$ Difficulty swallowing. Genitourinary:  '[]'$ Chronic kidney disease   '[]'$ Difficult urination  '[]'$ Frequent urination   '[]'$ Blood in urine Skin:  '[]'$ Rashes   '[]'$ Ulcers  Psychological:  '[]'$ History of anxiety   '[]'$  History of major depression.  Physical Examination  There were no vitals filed for this visit. There is no height or weight on file to calculate BMI. Gen: WD/WN, NAD Head: [place]/AT, No temporalis wasting.  Ear/Nose/Throat: Hearing grossly intact, nares w/o erythema or drainage Eyes:  PER, EOMI, sclera nonicteric.  Neck: Supple, no masses.  No bruit or JVD.  Pulmonary:  Good air movement, no audible wheezing, no use of accessory muscles.  Cardiac: RRR, normal S1, S2, no Murmurs. Vascular:  carotid bruit noted Vessel Right Left  Radial Palpable Palpable  Carotid  Palpable  Palpable  Subclav  Palpable Palpable  Gastrointestinal: soft, non-distended. No guarding/no peritoneal signs.  Musculoskeletal: M/S 5/5 throughout.  No visible deformity.  Neurologic: CN 2-12 intact. Pain and light touch intact in extremities.  Symmetrical.  Speech is fluent. Motor exam as listed above. Psychiatric: Judgment intact, Mood & affect appropriate for pt's clinical situation. Dermatologic: No rashes or ulcers noted.  No changes consistent with cellulitis.   CBC Lab Results  Component Value Date   WBC 9.1 09/03/2014   HGB 12.0 09/03/2014   HCT 36.9 09/03/2014   MCV 86 09/03/2014   PLT 172 09/03/2014    BMET    Component Value Date/Time   NA 136 09/03/2014 2240   K 4.1 09/03/2014 2240   CL 103 09/03/2014 2240   CO2 24 09/03/2014 2240   GLUCOSE 202 (H) 09/03/2014 2240   BUN 17 09/03/2014 2240   CREATININE 1.11 09/03/2014 2240   CALCIUM 8.8 09/03/2014 2240   GFRNONAA 53 (L) 09/03/2014 2240   GFRAA >60 09/03/2014 2240   CrCl cannot be calculated (Patient's most recent lab result is older than the maximum 21 days allowed.).  COAG No results found for: "INR", "PROTIME"  Radiology No results found.   Assessment/Plan There are no diagnoses linked to this encounter.   Hortencia Pilar, MD  07/16/2022 3:05 PM

## 2022-07-17 ENCOUNTER — Ambulatory Visit (INDEPENDENT_AMBULATORY_CARE_PROVIDER_SITE_OTHER): Payer: BC Managed Care – PPO

## 2022-07-17 ENCOUNTER — Encounter (INDEPENDENT_AMBULATORY_CARE_PROVIDER_SITE_OTHER): Payer: Self-pay | Admitting: Vascular Surgery

## 2022-07-17 ENCOUNTER — Ambulatory Visit (INDEPENDENT_AMBULATORY_CARE_PROVIDER_SITE_OTHER): Payer: BC Managed Care – PPO | Admitting: Vascular Surgery

## 2022-07-17 VITALS — BP 126/60 | HR 62 | Resp 16 | Wt 268.8 lb

## 2022-07-17 DIAGNOSIS — I6523 Occlusion and stenosis of bilateral carotid arteries: Secondary | ICD-10-CM

## 2022-07-17 DIAGNOSIS — E1142 Type 2 diabetes mellitus with diabetic polyneuropathy: Secondary | ICD-10-CM

## 2022-07-17 DIAGNOSIS — I10 Essential (primary) hypertension: Secondary | ICD-10-CM | POA: Diagnosis not present

## 2022-07-17 DIAGNOSIS — E782 Mixed hyperlipidemia: Secondary | ICD-10-CM

## 2022-07-17 DIAGNOSIS — I25118 Atherosclerotic heart disease of native coronary artery with other forms of angina pectoris: Secondary | ICD-10-CM

## 2022-07-18 ENCOUNTER — Encounter (INDEPENDENT_AMBULATORY_CARE_PROVIDER_SITE_OTHER): Payer: Self-pay | Admitting: Vascular Surgery

## 2022-10-12 ENCOUNTER — Other Ambulatory Visit: Payer: Self-pay | Admitting: Internal Medicine

## 2022-10-12 DIAGNOSIS — Z1231 Encounter for screening mammogram for malignant neoplasm of breast: Secondary | ICD-10-CM

## 2022-11-14 ENCOUNTER — Ambulatory Visit
Admission: RE | Admit: 2022-11-14 | Discharge: 2022-11-14 | Disposition: A | Discharge status: Home / Self Care | Payer: BC Managed Care – PPO | Source: Ambulatory Visit | Attending: Internal Medicine | Admitting: Internal Medicine

## 2022-11-14 DIAGNOSIS — Z1231 Encounter for screening mammogram for malignant neoplasm of breast: Secondary | ICD-10-CM | POA: Diagnosis present

## 2023-02-07 ENCOUNTER — Ambulatory Visit: Payer: BC Managed Care – PPO | Admitting: Dermatology

## 2023-02-07 VITALS — BP 97/63

## 2023-02-07 DIAGNOSIS — Z1283 Encounter for screening for malignant neoplasm of skin: Secondary | ICD-10-CM

## 2023-02-07 DIAGNOSIS — Z85828 Personal history of other malignant neoplasm of skin: Secondary | ICD-10-CM

## 2023-02-07 DIAGNOSIS — L578 Other skin changes due to chronic exposure to nonionizing radiation: Secondary | ICD-10-CM

## 2023-02-07 DIAGNOSIS — W908XXA Exposure to other nonionizing radiation, initial encounter: Secondary | ICD-10-CM | POA: Diagnosis not present

## 2023-02-07 DIAGNOSIS — D485 Neoplasm of uncertain behavior of skin: Secondary | ICD-10-CM

## 2023-02-07 DIAGNOSIS — C44319 Basal cell carcinoma of skin of other parts of face: Secondary | ICD-10-CM

## 2023-02-07 DIAGNOSIS — D1801 Hemangioma of skin and subcutaneous tissue: Secondary | ICD-10-CM

## 2023-02-07 DIAGNOSIS — L821 Other seborrheic keratosis: Secondary | ICD-10-CM

## 2023-02-07 DIAGNOSIS — L814 Other melanin hyperpigmentation: Secondary | ICD-10-CM

## 2023-02-07 DIAGNOSIS — D229 Melanocytic nevi, unspecified: Secondary | ICD-10-CM

## 2023-02-07 NOTE — Progress Notes (Signed)
   Follow-Up Visit   Subjective  Alexandra Hudson is a 72 y.o. female who presents for the following: Skin Cancer Screening and Full Body Skin Exam  The patient presents for Total-Body Skin Exam (TBSE) for skin cancer screening and mole check. The patient has spots, moles and lesions to be evaluated, some may be new or changing and the patient may have concern these could be cancer.    The following portions of the chart were reviewed this encounter and updated as appropriate: medications, allergies, medical history  Review of Systems:  No other skin or systemic complaints except as noted in HPI or Assessment and Plan.  Objective  Well appearing patient in no apparent distress; mood and affect are within normal limits.  A full examination was performed including scalp, head, eyes, ears, nose, lips, neck, chest, axillae, abdomen, back, buttocks, bilateral upper extremities, bilateral lower extremities, hands, feet, fingers, toes, fingernails, and toenails. All findings within normal limits unless otherwise noted below.   Relevant physical exam findings are noted in the Assessment and Plan.  Left Temple 1.0 x 1.2 cm pearly plaque         Assessment & Plan   SKIN CANCER SCREENING PERFORMED TODAY.  ACTINIC DAMAGE - Chronic condition, secondary to cumulative UV/sun exposure - diffuse scaly erythematous macules with underlying dyspigmentation - Recommend daily broad spectrum sunscreen SPF 30+ to sun-exposed areas, reapply every 2 hours as needed.  - Staying in the shade or wearing long sleeves, sun glasses (UVA+UVB protection) and wide brim hats (4-inch brim around the entire circumference of the hat) are also recommended for sun protection.  - Call for new or changing lesions.  LENTIGINES, SEBORRHEIC KERATOSES, HEMANGIOMAS - Benign normal skin lesions - Benign-appearing - Call for any changes  MELANOCYTIC NEVI - Tan-brown and/or pink-flesh-colored symmetric macules and  papules - Benign appearing on exam today - Observation - Call clinic for new or changing moles - Recommend daily use of broad spectrum spf 30+ sunscreen to sun-exposed areas.   HISTORY OF BASAL CELL CARCINOMA OF THE SKIN - No evidence of recurrence today - Recommend regular full body skin exams - Recommend daily broad spectrum sunscreen SPF 30+ to sun-exposed areas, reapply every 2 hours as needed.  - Call if any new or changing lesions are noted between office visits   Neoplasm of uncertain behavior of skin Left Temple  Skin / nail biopsy Type of biopsy: tangential   Informed consent: discussed and consent obtained   Timeout: patient name, date of birth, surgical site, and procedure verified   Procedure prep:  Patient was prepped and draped in usual sterile fashion Prep type:  Isopropyl alcohol Anesthesia: the lesion was anesthetized in a standard fashion   Anesthetic:  1% lidocaine w/ epinephrine 1-100,000 buffered w/ 8.4% NaHCO3 Instrument used: flexible razor blade   Hemostasis achieved with: pressure, aluminum chloride and electrodesiccation   Outcome: patient tolerated procedure well   Post-procedure details: sterile dressing applied and wound care instructions given   Dressing type: bandage and petrolatum    Specimen 1 - Surgical pathology Differential Diagnosis: BCC vs other  Check Margins: No  Will plan Mohs with Dr Jeannine Boga if [facility]   Return in about 1 year (around 02/07/2024) for TBSE.  I, Joanie Coddington, CMA, am acting as scribe for Armida Sans, MD .   Documentation: I have reviewed the above documentation for accuracy and completeness, and I agree with the above.  Armida Sans, MD

## 2023-02-07 NOTE — Patient Instructions (Addendum)
Shave Excision Benign Lesion Wound Care Instructions  Leave the original bandage on for 24 hours if possible.  If the bandage becomes soaked or soiled before that time, it is OK to remove it and examine the wound.  A small amount of post-operative bleeding is normal.  If excessive bleeding occurs, remove the bandage, place gauze over the site and apply continuous pressure (no peeking) over the area for 20-30 minutes.  If this does not stop the bleeding, try again for 40 minutes.  If this does not work, please call our clinic as soon as possible (even if after-hours).    Twice a day, cleanse the wound with soap and water.  If a thick crust develops you may use a Q-tip dipped into dilute hydrogen peroxide (mix 1:1 with water) to dissolve it.  Hydrogen peroxide can slow the healing process, so use it only as needed.  After washing, apply Vaseline jelly or Polysporin ointment.  For best healing, the wound should be covered with a layer of ointment at all times.  This may mean re-applying the ointment several times a day.  For open wounds, continue until it has healed.    If you have any swelling, keep the area elevated.  Some redness, tenderness and white or yellow material in the wound is normal healing.  If the area becomes very sore and red, or develops a thick yellow-green material (pus), it may be infected; please notify us.    Wound healing continues for up to one year following surgery.  It is not unusual to experience pain in the scar from time to time during the interval.  If the pain becomes severe or the scar thickens, you should notify the office.  A slight amount of redness in a scar is expected for the first six months.  After six months, the redness subsides and the scar will soften and fade.  The color difference becomes less noticeable with time.  If there are any problems, return for a post-op surgery check at your earliest convenience.  Please call our office for any questions or  concerns.  Due to recent changes in healthcare laws, you may see results of your pathology and/or laboratory studies on MyChart before the doctors have had a chance to review them. We understand that in some cases there may be results that are confusing or concerning to you. Please understand that not all results are received at the same time and often the doctors may need to interpret multiple results in order to provide you with the best plan of care or course of treatment. Therefore, we ask that you please give us 2 business days to thoroughly review all your results before contacting the office for clarification. Should we see a critical lab result, you will be contacted sooner.   If You Need Anything After Your Visit  If you have any questions or concerns for your doctor, please call our main line at [phone] and press option 4 to reach your doctor's medical assistant. If no one answers, please leave a voicemail as directed and we will return your call as soon as possible. Messages left after 4 pm will be answered the following business day.   You may also send us a message via MyChart. We typically respond to MyChart messages within 1-2 business days.  For prescription refills, please ask your pharmacy to contact our office. Our fax number is [phone].  If you have an urgent issue when the clinic is closed that   cannot wait until the next business day, you can page your doctor at the number below.    Please note that while we do our best to be available for urgent issues outside of office hours, we are not available 24/7.   If you have an urgent issue and are unable to reach us, you may choose to seek medical care at your doctor's office, retail clinic, urgent care center, or emergency room.  If you have a medical emergency, please immediately call 911 or go to the emergency department.  Pager Numbers  - Dr. Kowalski: [phone]  - Dr. Moye: [phone]  - Dr. Stewart:  [phone]  In the event of inclement weather, please call our main line at [phone] for an update on the status of any delays or closures.  Dermatology Medication Tips: Please keep the boxes that topical medications come in in order to help keep track of the instructions about where and how to use these. Pharmacies typically print the medication instructions only on the boxes and not directly on the medication tubes.   If your medication is too expensive, please contact our office at [phone] option 4 or send us a message through MyChart.   We are unable to tell what your co-pay for medications will be in advance as this is different depending on your insurance coverage. However, we may be able to find a substitute medication at lower cost or fill out paperwork to get insurance to cover a needed medication.   If a prior authorization is required to get your medication covered by your insurance company, please allow us 1-2 business days to complete this process.  Drug prices often vary depending on where the prescription is filled and some pharmacies may offer cheaper prices.  The website www.goodrx.com contains coupons for medications through different pharmacies. The prices here do not account for what the cost may be with help from insurance (it may be cheaper with your insurance), but the website can give you the price if you did not use any insurance.  - You can print the associated coupon and take it with your prescription to the pharmacy.  - You may also stop by our office during regular business hours and pick up a GoodRx coupon card.  - If you need your prescription sent electronically to a different pharmacy, notify our office through [place] MyChart or by phone at [phone] option 4.     Si Usted Necesita Algo Despus de Su Visita  Tambin puede enviarnos un mensaje a travs de MyChart. Por lo general respondemos a los mensajes de MyChart en el transcurso de 1 a 2  das hbiles.  Para renovar recetas, por favor pida a su farmacia que se ponga en contacto con nuestra oficina. Nuestro nmero de fax es el [phone].  Si tiene un asunto urgente cuando la clnica est cerrada y que no puede esperar hasta el siguiente da hbil, puede llamar/localizar a su doctor(a) al nmero que aparece a continuacin.   Por favor, tenga en cuenta que aunque hacemos todo lo posible para estar disponibles para asuntos urgentes fuera del horario de oficina, no estamos disponibles las 24 horas del da, los 7 das de la semana.   Si tiene un problema urgente y no puede comunicarse con nosotros, puede optar por buscar atencin mdica  en el consultorio de su doctor(a), en una clnica privada, en un centro de atencin urgente o en una sala de emergencias.  Si tiene una emergencia mdica, por favor   llame inmediatamente al 911 o vaya a la sala de emergencias.  Nmeros de bper  - Dr. Kowalski: [phone]  - Dra. Moye: [phone]  - Dra. Stewart: [phone]  En caso de inclemencias del tiempo, por favor llame a nuestra lnea principal al [phone] para una actualizacin sobre el estado de cualquier retraso o cierre.  Consejos para la medicacin en dermatologa: Por favor, guarde las cajas en las que vienen los medicamentos de uso tpico para ayudarle a seguir las instrucciones sobre dnde y cmo usarlos. Las farmacias generalmente imprimen las instrucciones del medicamento slo en las cajas y no directamente en los tubos del medicamento.   Si su medicamento es muy caro, por favor, pngase en contacto con nuestra oficina llamando al [phone] y presione la opcin 4 o envenos un mensaje a travs de MyChart.   No podemos decirle cul ser su copago por los medicamentos por adelantado ya que esto es diferente dependiendo de la cobertura de su seguro. Sin embargo, es posible que podamos encontrar un medicamento sustituto a menor costo o llenar un formulario para que el  seguro cubra el medicamento que se considera necesario.   Si se requiere una autorizacin previa para que su compaa de seguros cubra su medicamento, por favor permtanos de 1 a 2 das hbiles para completar este proceso.  Los precios de los medicamentos varan con frecuencia dependiendo del lugar de dnde se surte la receta y alguna farmacias pueden ofrecer precios ms baratos.  El sitio web www.goodrx.com tiene cupones para medicamentos de diferentes farmacias. Los precios aqu no tienen en cuenta lo que podra costar con la ayuda del seguro (puede ser ms barato con su seguro), pero el sitio web puede darle el precio si no utiliz ningn seguro.  - Puede imprimir el cupn correspondiente y llevarlo con su receta a la farmacia.  - Tambin puede pasar por nuestra oficina durante el horario de atencin regular y recoger una tarjeta de cupones de GoodRx.  - Si necesita que su receta se enve electrnicamente a una farmacia diferente, informe a nuestra oficina a travs de MyChart de [place] o por telfono llamando al [phone] y presione la opcin 4.  

## 2023-02-09 ENCOUNTER — Encounter: Payer: Self-pay | Admitting: Dermatology

## 2023-02-15 ENCOUNTER — Telehealth: Payer: Self-pay

## 2023-02-15 NOTE — Telephone Encounter (Signed)
LM on VM please call here to discuss biopsy results  

## 2023-02-15 NOTE — Telephone Encounter (Signed)
-----   Message from Willeen Niece sent at 02/15/2023 11:28 AM EDT ----- Skin , left temple BASAL CELL CARCINOMA, INFILTRATIVE PATTERN  BCC skin cancer, needs referral for Mohs, Dr. Jeannine Boga - please call patient

## 2023-02-19 ENCOUNTER — Telehealth: Payer: Self-pay

## 2023-02-19 DIAGNOSIS — C44319 Basal cell carcinoma of skin of other parts of face: Secondary | ICD-10-CM

## 2023-02-19 NOTE — Telephone Encounter (Signed)
-----   Message from Willeen Niece sent at 02/15/2023 11:28 AM EDT -----  ----- Message ----- From: Interface, Lab In [place] Sent: 02/13/2023   4:32 PM EDT To: Deirdre Evener, MD

## 2023-02-19 NOTE — Telephone Encounter (Signed)
Discussed biopsy results with patient, will send referral to [facility] in [facility] for mohs

## 2023-07-30 IMAGING — MG MM DIGITAL SCREENING BILAT W/ TOMO AND CAD
8 series · 8 of 24 positions shown · non-contrast
Comparison: Previous exam(s).

CLINICAL DATA: Screening.

EXAM:
DIGITAL SCREENING BILATERAL MAMMOGRAM WITH TOMOSYNTHESIS AND CAD
TECHNIQUE: Bilateral screening digital craniocaudal and mediolateral oblique
mammograms were obtained. Bilateral screening digital breast
tomosynthesis was performed. The images were evaluated with
computer-aided detection.

[R CC synth-2D]
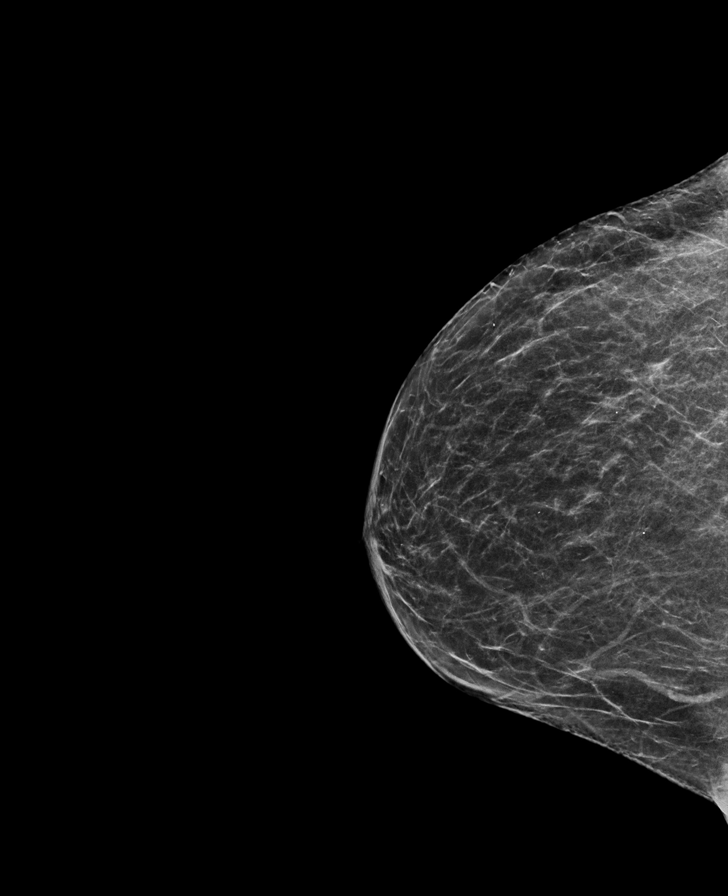

[L MLO synth-2D]
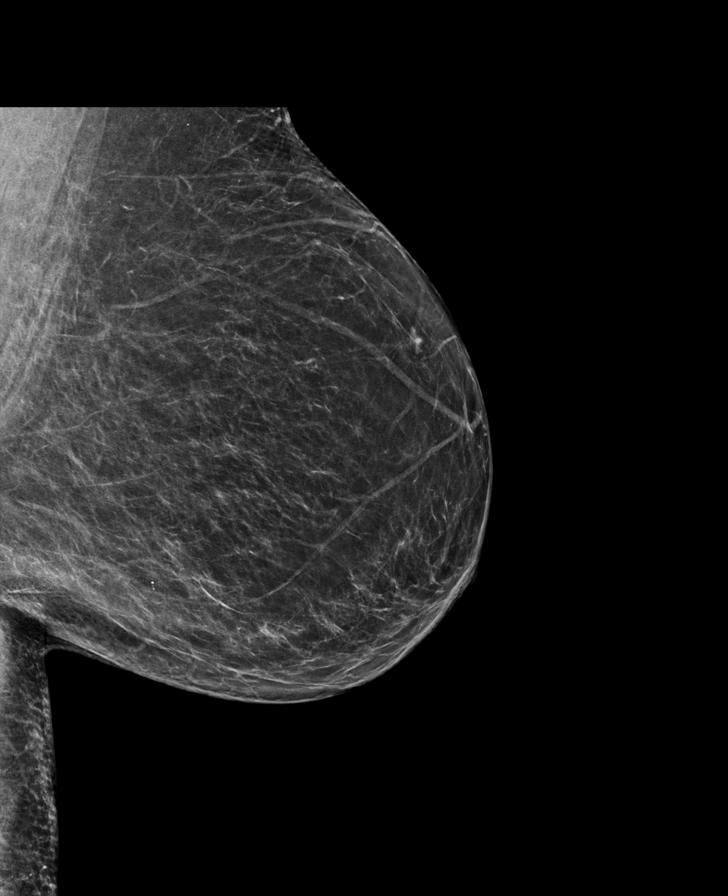

[R MLO synth-2D]
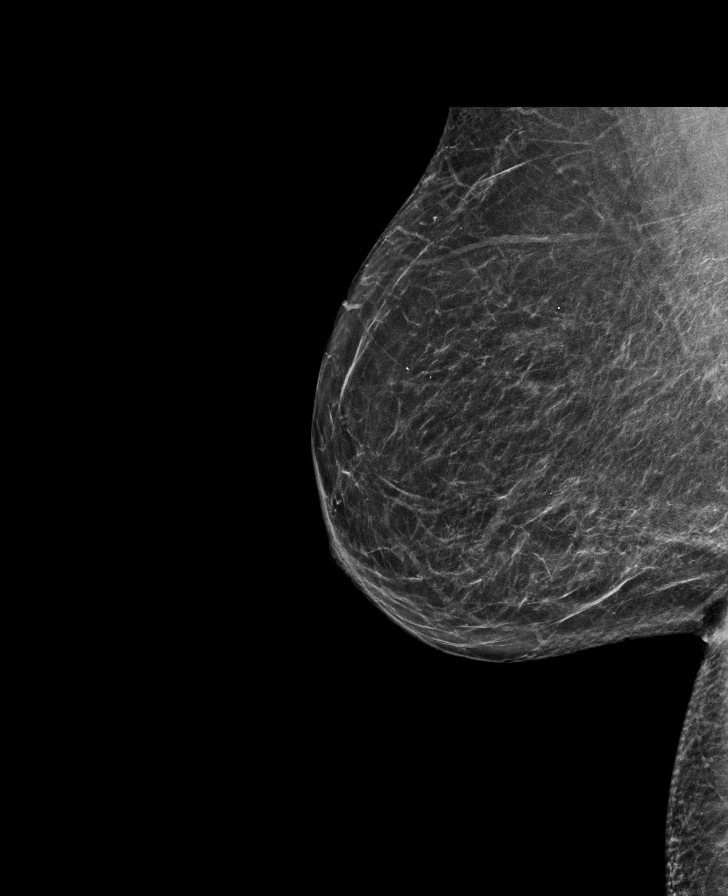

[L CC synth-2D]
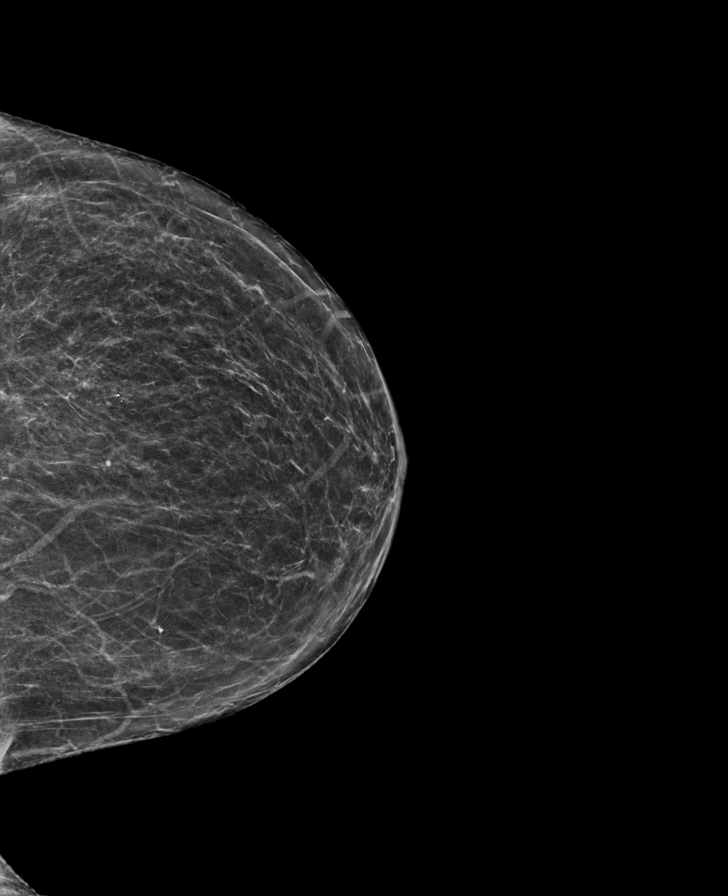

[R CC tomo · tomo slice 33/64.0]
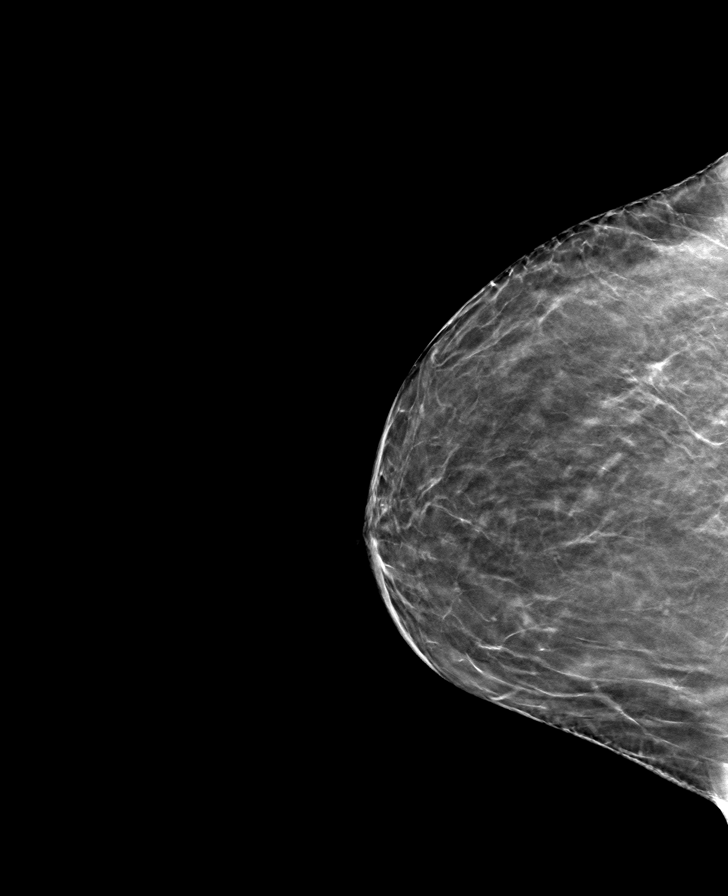

[L MLO tomo · tomo slice 35/70.0]
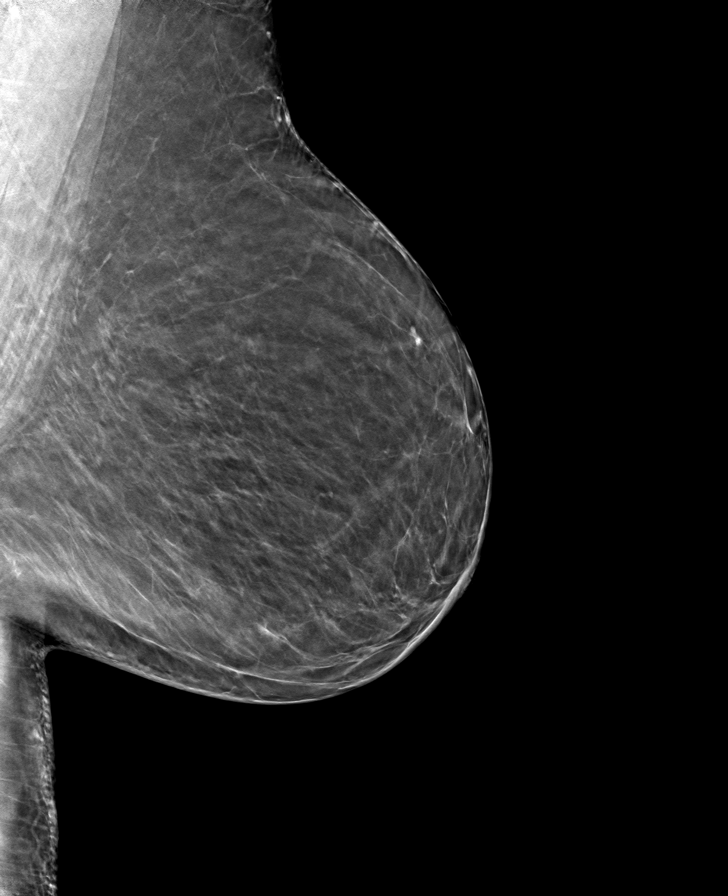

[R MLO tomo · tomo slice 36/71.0]
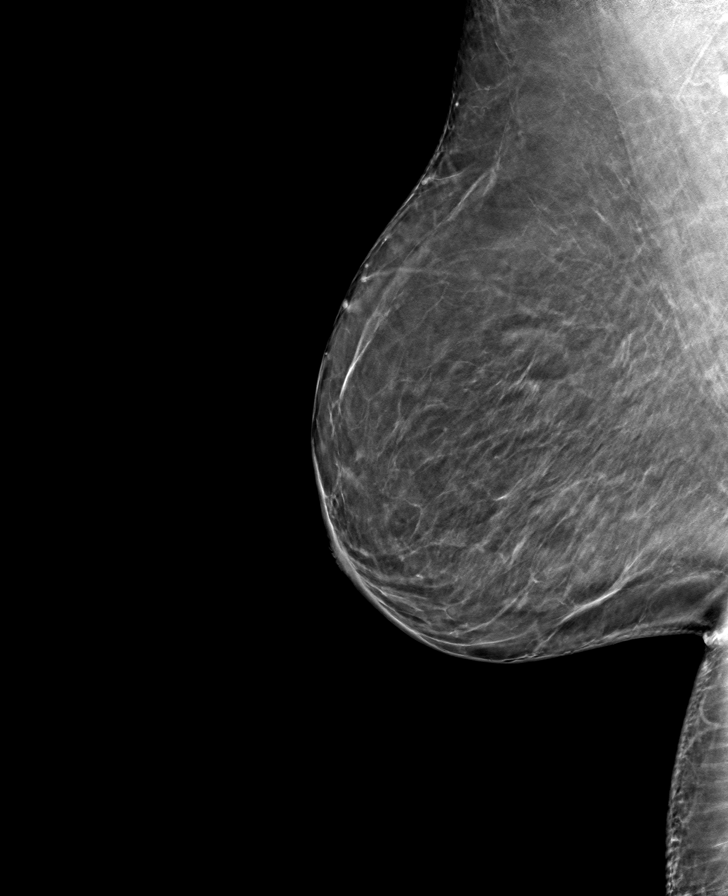

[L CC tomo · tomo slice 31/60.0]
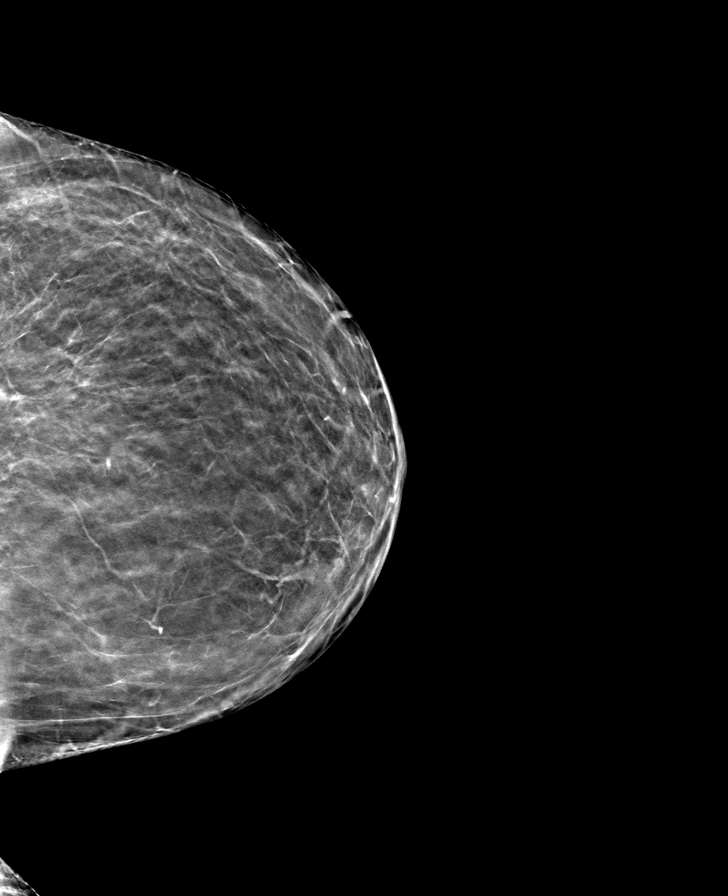

[8 of 24 positions shown; findings below may reference images not displayed]

ACR Breast Density Category b: There are scattered areas of
fibroglandular density.
FINDINGS: There are no findings suspicious for malignancy.
IMPRESSION: No mammographic evidence of malignancy. A result letter of this
screening mammogram will be mailed directly to the patient.

RECOMMENDATION:
Screening mammogram in one year. (Code:51-O-LD2)

BI-RADS CATEGORY  1: Negative.

## 2023-10-08 ENCOUNTER — Other Ambulatory Visit: Payer: Self-pay | Admitting: Internal Medicine

## 2023-10-08 DIAGNOSIS — Z1231 Encounter for screening mammogram for malignant neoplasm of breast: Secondary | ICD-10-CM

## 2023-11-15 ENCOUNTER — Ambulatory Visit
Admission: RE | Admit: 2023-11-15 | Discharge: 2023-11-15 | Disposition: A | Discharge status: Home / Self Care | Source: Ambulatory Visit | Attending: Internal Medicine | Admitting: Internal Medicine

## 2023-11-15 DIAGNOSIS — Z1231 Encounter for screening mammogram for malignant neoplasm of breast: Secondary | ICD-10-CM | POA: Diagnosis present

## 2023-12-18 ENCOUNTER — Encounter (INDEPENDENT_AMBULATORY_CARE_PROVIDER_SITE_OTHER): Payer: Self-pay

## 2024-02-07 ENCOUNTER — Ambulatory Visit: Payer: Medicare (Managed Care) | Admitting: Dermatology

## 2024-02-25 ENCOUNTER — Ambulatory Visit: Payer: Medicare (Managed Care) | Admitting: Dermatology

## 2024-03-18 ENCOUNTER — Encounter: Payer: Self-pay | Admitting: Dermatology

## 2024-03-18 ENCOUNTER — Ambulatory Visit (INDEPENDENT_AMBULATORY_CARE_PROVIDER_SITE_OTHER): Payer: Medicare (Managed Care) | Admitting: Dermatology

## 2024-03-18 DIAGNOSIS — L820 Inflamed seborrheic keratosis: Secondary | ICD-10-CM | POA: Diagnosis not present

## 2024-03-18 DIAGNOSIS — W908XXA Exposure to other nonionizing radiation, initial encounter: Secondary | ICD-10-CM

## 2024-03-18 DIAGNOSIS — Z1283 Encounter for screening for malignant neoplasm of skin: Secondary | ICD-10-CM

## 2024-03-18 DIAGNOSIS — D239 Other benign neoplasm of skin, unspecified: Secondary | ICD-10-CM

## 2024-03-18 DIAGNOSIS — D229 Melanocytic nevi, unspecified: Secondary | ICD-10-CM

## 2024-03-18 DIAGNOSIS — L578 Other skin changes due to chronic exposure to nonionizing radiation: Secondary | ICD-10-CM | POA: Diagnosis not present

## 2024-03-18 DIAGNOSIS — D492 Neoplasm of unspecified behavior of bone, soft tissue, and skin: Secondary | ICD-10-CM | POA: Diagnosis not present

## 2024-03-18 DIAGNOSIS — L814 Other melanin hyperpigmentation: Secondary | ICD-10-CM | POA: Diagnosis not present

## 2024-03-18 DIAGNOSIS — Z85828 Personal history of other malignant neoplasm of skin: Secondary | ICD-10-CM

## 2024-03-18 DIAGNOSIS — D225 Melanocytic nevi of trunk: Secondary | ICD-10-CM

## 2024-03-18 HISTORY — DX: Other benign neoplasm of skin, unspecified: D23.9

## 2024-03-18 NOTE — Progress Notes (Signed)
 Follow-Up Visit   Subjective  Alexandra Hudson is a 73 y.o. female who presents for the following: Skin Cancer Screening and Full Body Skin Exam, hx of BCC  The patient presents for Total-Body Skin Exam (TBSE) for skin cancer screening and mole check. The patient has spots, moles and lesions to be evaluated, some may be new or changing and the patient may have concern these could be cancer.  The following portions of the chart were reviewed this encounter and updated as appropriate: medications, allergies, medical history  Review of Systems:  No other skin or systemic complaints except as noted in HPI or Assessment and Plan.  Objective  Well appearing patient in no apparent distress; mood and affect are within normal limits.  A full examination was performed including scalp, head, eyes, ears, nose, lips, neck, chest, axillae, abdomen, back, buttocks, bilateral upper extremities, bilateral lower extremities, hands, feet, fingers, toes, fingernails, and toenails. All findings within normal limits unless otherwise noted below.   Relevant physical exam findings are noted in the Assessment and Plan.  right medial breast x 1 Stuck-on, waxy, tan-brown papule or plaque --Discussed benign etiology and prognosis.  low back spinal 0.6 cm Irregular brown macule        Assessment & Plan   SKIN CANCER SCREENING PERFORMED TODAY.  ACTINIC DAMAGE - Chronic condition, secondary to cumulative UV/sun exposure - diffuse scaly erythematous macules with underlying dyspigmentation - Recommend daily broad spectrum sunscreen SPF 30+ to sun-exposed areas, reapply every 2 hours as needed.  - Staying in the shade or wearing long sleeves, sun glasses (UVA+UVB protection) and wide brim hats (4-inch brim around the entire circumference of the hat) are also recommended for sun protection.  - Call for new or changing lesions.  LENTIGINES, SEBORRHEIC KERATOSES, HEMANGIOMAS - Benign normal skin lesions -  Benign-appearing - Call for any changes  MELANOCYTIC NEVI - Tan-brown and/or pink-flesh-colored symmetric macules and papules - Benign appearing on exam today - Observation - Call clinic for new or changing moles - Recommend daily use of broad spectrum spf 30+ sunscreen to sun-exposed areas.   HISTORY OF BASAL CELL CARCINOMA OF THE SKIN - No evidence of recurrence today - Recommend regular full body skin exams - Recommend daily broad spectrum sunscreen SPF 30+ to sun-exposed areas, reapply every 2 hours as needed.  - Call if any new or changing lesions are noted between office visits   INFLAMED SEBORRHEIC KERATOSIS right medial breast x 1 Symptomatic, irritating, patient would like treated.  Destruction of lesion - right medial breast x 1 Complexity: simple   Destruction method: cryotherapy   Informed consent: discussed and consent obtained   Timeout:  patient name, date of birth, surgical site, and procedure verified Lesion destroyed using liquid nitrogen: Yes   Region frozen until ice ball extended beyond lesion: Yes   Outcome: patient tolerated procedure well with no complications   Post-procedure details: wound care instructions given    NEOPLASM OF SKIN low back spinal Epidermal / dermal shaving  Lesion diameter (cm):  0.6 Informed consent: discussed and consent obtained   Timeout: patient name, date of birth, surgical site, and procedure verified   Procedure prep:  Patient was prepped and draped in usual sterile fashion Prep type:  Isopropyl alcohol Anesthesia: the lesion was anesthetized in a standard fashion   Anesthetic:  1% lidocaine  w/ epinephrine 1-100,000 buffered w/ 8.4% NaHCO3 Hemostasis achieved with: pressure, aluminum chloride and electrodesiccation   Outcome: patient tolerated procedure well  Post-procedure details: sterile dressing applied and wound care instructions given   Dressing type: bandage and petrolatum    Specimen 1 - Surgical  pathology Differential Diagnosis: R/O Dysplastic nevus   Check Margins: No   Return in about 1 year (around 03/18/2025) for TBSE, hx of BCC.   IFay Kirks, CMA, am acting as scribe for Alm Rhyme, MD .   Documentation: I have reviewed the above documentation for accuracy and completeness, and I agree with the above.  Alm Rhyme, MD

## 2024-03-18 NOTE — Patient Instructions (Addendum)
 Cryotherapy Aftercare  Wash gently with soap and water everyday.   Apply Vaseline and Band-Aid daily until healed.    Wound Care Instructions  Cleanse wound gently with soap and water once a day then pat dry with clean gauze. Apply a thin coat of Petrolatum (petroleum jelly, Vaseline) over the wound (unless you have an allergy to this). We recommend that you use a new, sterile tube of Vaseline. Do not pick or remove scabs. Do not remove the yellow or white healing tissue from the base of the wound.  Cover the wound with fresh, clean, nonstick gauze and secure with paper tape. You may use Band-Aids in place of gauze and tape if the wound is small enough, but would recommend trimming much of the tape off as there is often too much. Sometimes Band-Aids can irritate the skin.  You should call the office for your biopsy report after 1 week if you have not already been contacted.  If you experience any problems, such as abnormal amounts of bleeding, swelling, significant bruising, significant pain, or evidence of infection, please call the office immediately.  FOR ADULT SURGERY PATIENTS: If you need something for pain relief you may take 1 extra strength Tylenol (acetaminophen) AND 2 Ibuprofen (200mg  each) together every 4 hours as needed for pain. (do not take these if you are allergic to them or if you have a reason you should not take them.) Typically, you may only need pain medication for 1 to 3 days.         Due to recent changes in healthcare laws, you may see results of your pathology and/or laboratory studies on MyChart before the doctors have had a chance to review them. We understand that in some cases there may be results that are confusing or concerning to you. Please understand that not all results are received at the same time and often the doctors may need to interpret multiple results in order to provide you with the best plan of care or course of treatment. Therefore, we ask  that you please give us  2 business days to thoroughly review all your results before contacting the office for clarification. Should we see a critical lab result, you will be contacted sooner.   If You Need Anything After Your Visit  If you have any questions or concerns for your doctor, please call our main line at [phone] and press option 4 to reach your doctor's medical assistant. If no one answers, please leave a voicemail as directed and we will return your call as soon as possible. Messages left after 4 pm will be answered the following business day.   You may also send us  a message via MyChart. We typically respond to MyChart messages within 1-2 business days.  For prescription refills, please ask your pharmacy to contact our office. Our fax number is [phone].  If you have an urgent issue when the clinic is closed that cannot wait until the next business day, you can page your doctor at the number below.    Please note that while we do our best to be available for urgent issues outside of office hours, we are not available 24/7.   If you have an urgent issue and are unable to reach us , you may choose to seek medical care at your doctor's office, retail clinic, urgent care center, or emergency room.  If you have a medical emergency, please immediately call 911 or go to the emergency department.  Pager Numbers  -  Dr. Hester: [phone]  - Dr. Jackquline: [phone]  - Dr. Claudene: [phone]   - Dr. Raymund: [phone]  In the event of inclement weather, please call our main line at [phone] for an update on the status of any delays or closures.  Dermatology Medication Tips: Please keep the boxes that topical medications come in in order to help keep track of the instructions about where and how to use these. Pharmacies typically print the medication instructions only on the boxes and not directly on the medication tubes.   If your medication is too expensive,  please contact our office at [phone] option 4 or send us  a message through MyChart.   We are unable to tell what your co-pay for medications will be in advance as this is different depending on your insurance coverage. However, we may be able to find a substitute medication at lower cost or fill out paperwork to get insurance to cover a needed medication.   If a prior authorization is required to get your medication covered by your insurance company, please allow us  1-2 business days to complete this process.  Drug prices often vary depending on where the prescription is filled and some pharmacies may offer cheaper prices.  The website www.goodrx.com contains coupons for medications through different pharmacies. The prices here do not account for what the cost may be with help from insurance (it may be cheaper with your insurance), but the website can give you the price if you did not use any insurance.  - You can print the associated coupon and take it with your prescription to the pharmacy.  - You may also stop by our office during regular business hours and pick up a GoodRx coupon card.  - If you need your prescription sent electronically to a different pharmacy, notify our office through [facility] or by phone at [phone] option 4.     Si Usted Necesita Algo Despus de Su Visita  Tambin puede enviarnos un mensaje a travs de Clinical cytogeneticist. Por lo general respondemos a los mensajes de MyChart en el transcurso de 1 a 2 das hbiles.  Para renovar recetas, por favor pida a su farmacia que se ponga en contacto con nuestra oficina. Randi lakes de fax es [place] [phone].  Si tiene un asunto urgente cuando la clnica est cerrada y que no puede esperar hasta el siguiente da hbil, puede llamar/localizar a su doctor(a) al nmero que aparece a continuacin.   Por favor, tenga en cuenta que aunque hacemos todo lo posible para estar disponibles para asuntos urgentes fuera del  horario de [place], no estamos disponibles las 24 horas del da, los 7 [address]  [address] de la [place].   Si tiene un problema urgente y no puede comunicarse con nosotros, puede optar por buscar atencin mdica  en el consultorio de su doctor(a), en una clnica privada, en un centro de atencin urgente o en una sala de emergencias.  Si tiene Engineer, drilling, por favor llame inmediatamente al 911 o vaya a la sala de emergencias.  Nmeros de bper  - Dr. Hester: [phone]  - Dra. Jackquline: [phone]  - Dr. Claudene: [phone]   En caso de inclemencias del tiempo, por favor llame a landry capes principal al [phone] para una actualizacin sobre el [place] de cualquier retraso o cierre.  Consejos para la medicacin en dermatologa: Por favor, guarde las cajas en las que vienen los medicamentos de uso tpico para ayudarle a seguir las instrucciones sobre dnde y cmo usarlos. Las  farmacias generalmente imprimen las instrucciones del medicamento slo en las cajas y no directamente en los tubos del [place].   Si su medicamento es muy caro, por favor, pngase en contacto con landry rieger llamando al [phone] y presione la opcin 4 o envenos un mensaje a travs de Clinical cytogeneticist.   No podemos decirle cul ser su copago por los medicamentos por adelantado ya que esto es diferente dependiendo de la cobertura de su seguro. Sin embargo, es posible que podamos encontrar un medicamento sustituto a Audiological scientist un formulario para que el seguro cubra el medicamento que se considera necesario.   Si se requiere una autorizacin previa para que su compaa de seguros malta su medicamento, por favor permtanos de 1 a 2 das hbiles para completar este proceso.  Los precios de los medicamentos varan con frecuencia dependiendo del Environmental consultant de dnde se surte la receta y alguna farmacias pueden ofrecer precios ms baratos.  El sitio web www.goodrx.com tiene cupones para medicamentos de Engineer, civil (consulting). Los precios aqu no tienen en cuenta lo que podra costar con la ayuda del seguro (puede ser ms barato con su seguro), pero el sitio web puede darle el precio si no utiliz Tourist information centre manager.  - Puede imprimir el cupn correspondiente y llevarlo con su receta a la farmacia.  - Tambin puede pasar por nuestra oficina durante el horario de atencin regular y Education officer, museum una tarjeta de cupones de GoodRx.  - Si necesita que su receta se enve electrnicamente a una farmacia diferente, informe a nuestra oficina a travs de MyChart de [place] o por telfono llamando al [phone] y presione la opcin 4.

## 2024-03-21 LAB — SURGICAL PATHOLOGY

## 2024-03-24 ENCOUNTER — Ambulatory Visit: Payer: Self-pay | Admitting: Dermatology

## 2024-03-25 ENCOUNTER — Encounter: Payer: Self-pay | Admitting: Dermatology

## 2024-03-25 NOTE — Telephone Encounter (Addendum)
 Called and discussed results with patient. She verbalized understanding. Patient scheduled for 6 month follow up to recheck area and explained my need additional procedure.   ----- Message from Alm Rhyme sent at 03/24/2024  5:05 PM EDT ----- FINAL DIAGNOSIS        1. Skin, low back spinal :       DYSPLASTIC COMPOUND NEVUS WITH SEVERE ATYPIA, MARGIN CLOSE, SEE DESCRIPTION   Severe dysplastic Margins free, but MARGINS CLOSE May need additional procedure Re-check in 6 mos.  Make pt appt for 6 mos. ----- Message ----- From: Interface, Lab In Three Zero One Sent: 03/21/2024   5:39 PM EDT To: Alm JAYSON Rhyme, MD

## 2024-06-16 NOTE — Progress Notes (Signed)
 ORTHOPAEDIC SURGERY- CLINIC NOTE  Chief Complaint: Left wrist pain  History of Present Illness: History of Present Illness Alexandra Hudson is a 73 year old female who presents with left wrist pain following a fall. She has been experiencing left wrist pain since the end of June after tripping over her dog and landing on the palm of her hand. She has difficulty bending her wrist down or backwards and experiences soreness in the knuckle area when pressure is applied. The wrist sometimes feels irritated and hypersensitive to touch.  X-rays were taken, which showed no fractures. She has been using a wrist brace and Voltaren gel for pain management. She avoids taking Tylenol or Aleve due to a kidney disorder.  There is a history of a ganglion cyst removal from the same wrist in 1991.  He notes that she has had some swelling in the wrist ever since then but it is worse since her fall in June.  She reports soreness and pain in her hand and wrist area, which sometimes gets irritated. She finds it difficult to move the wrist at night and has to be cautious with movements to avoid pain.  No numbness or tingling. Reports soreness in the knuckle area and generalized hand and wrist pain. No pain in specific areas when tested, except for soreness in the knuckle.  Prior medical records from her PCP were reviewed.    PMHx, PSurgHx, Fam Hx, Soc Hx, Meds, Allergies: Past Medical History:  Diagnosis Date  . Coronary artery disease   . Diabetes mellitus type 2, uncomplicated (CMS/HHS-HCC)   . Esophageal dilatation 2009  . Hypercholesterolemia   . Hypertension   . Microalbuminuria   . Peripheral neuropathy   . Postmenopausal bleeding     Past Surgical History:  Procedure Laterality Date  . CHOLECYSTECTOMY  2005  . COLONOSCOPY  04/15/2004   Dr. Quentin  . EGD  10/14/2007   Dr. Jinny  . COLONOSCOPY  01/27/2010   Repeat/OH.  01/28/2015  . HYSTERECTOMY AND OOPHORECTOMY  2012  . COLONOSCOPY   01/14/2015   Entire examined colon is normal/PHx of polyps/Repeat 24yrs/PYO  . TOOTH EXTRACTION Left 02/21/2018   4 TEETH REMOVED, 3 on bottom and 1 on top  . TOOTH EXTRACTION Right 09/09/2018  . COLONOSCOPY  03/22/2020   Normal colon/PHx CP/Repeat 22yrs/CTL  . BLADDER TACK    . CORONARY ANGIOPLASTY     Stent placed 2006  . Ganglion cyst excision right wrist    . TONSILLECTOMY    . TUBAL LIGATION      Family History  Problem Relation Age of Onset  . Skin cancer Mother   . Atrial fibrillation (Abnormal heart rhythm sometimes requiring treatment with blood thinners) Mother   . Heart failure Father   . Multiple sclerosis Sister   . Congenital heart disease Brother     Social History   Socioeconomic History  . Marital status: Married    Spouse name: Lamar  . Number of children: 2  . Years of education: 12  Tobacco Use  . Smoking status: Never  . Smokeless tobacco: Never  Vaping Use  . Vaping status: Never Used  Substance and Sexual Activity  . Alcohol use: Not Currently  . Drug use: No  . Sexual activity: Defer   Social Drivers of Health   Financial Resource Strain: Low Risk  (06/16/2024)   Overall Financial Resource Strain (CARDIA)   . Difficulty of Paying Living Expenses: Not hard at all  Food Insecurity: No  Food Insecurity (06/16/2024)   Hunger Vital Sign   . Worried About Programme Researcher, Broadcasting/film/video in the Last Year: Never true   . Ran Out of Food in the Last Year: Never true  Transportation Needs: No Transportation Needs (06/16/2024)   PRAPARE - Transportation   . Lack of Transportation (Medical): No   . Lack of Transportation (Non-Medical): No  Housing Stability: Low Risk  (06/16/2024)   Housing Stability Vital Sign   . Unable to Pay for Housing in the Last Year: No   . Number of Times Moved in the Last Year: 0   . Homeless in the Last Year: No     Current Outpatient Medications  Medication Sig Dispense Refill  . aspirin 81 MG chewable tablet Take 81 mg by mouth  once daily.    . blood glucose diagnostic test strip Use 3 (three) times daily. Use as instructed. One Touch Ultra    . cholecalciferol (VITAMIN D3) 1,000 unit capsule Take 1,000 Units by mouth once daily.    . cyanocobalamin, vitamin B-12, (VITAMIN B-12 ORAL) Take by mouth One daily    . gabapentin (NEURONTIN) 300 MG capsule TAKE 1 CAPSULE (300 MG TOTAL) BY MOUTH 2 (TWO) TIMES DAILY FOR 180 DAYS 180 capsule 1  . levocetirizine (XYZAL) 5 MG tablet TAKE 0.5 TABLETS (2.5 MG TOTAL) BY MOUTH 2 (TWO) TIMES DAILY FOR 180 DAYS 90 tablet 1  . metoprolol  SUCCinate (TOPROL -XL) 25 MG XL tablet Take 1 tablet (25 mg total) by mouth once daily 90 tablet 1  . mometasone (NASONEX) 50 mcg/actuation nasal spray SPRAY 2 SPRAYS INTO EACH NOSTRIL EVERY DAY 17 g 5  . montelukast (SINGULAIR) 10 mg tablet Take 1 tablet (10 mg total) by mouth nightly 90 tablet 1  . ramipriL (ALTACE) 5 MG capsule Take 1 capsule (5 mg total) by mouth once daily 90 capsule 1  . rosuvastatin (CRESTOR) 10 MG tablet TAKE 1 TABLET BY MOUTH EVERY DAY 90 tablet 1  . tirzepatide (MOUNJARO) 10 mg/0.5 mL pen injector Inject 0.5 mLs (10 mg total) subcutaneously once a week 0.5 mL 3  . valACYclovir (VALTREX) 1000 MG tablet Take 1 tablet (1,000 mg total) by mouth 2 (two) times daily 2 tablet 5  . methylPREDNISolone (MEDROL DOSEPACK) 4 mg tablet Follow package directions. 21 tablet 0  . PREVIDENT 5000 DRY MOUTH 1.1 % gel BRUSH WITH PASTE 2-3 TIMES A DAY. DO NOT RINSE (Patient not taking: Reported on 06/16/2024)  5   No current facility-administered medications for this visit.    No Known Allergies  Review of Systems: A 10+ ROS was performed, reviewed, and the pertinent orthopaedic findings are documented in the HPI.  I have reviewed and agree with the ROS captured by the CMA.    Physical Exam: BP 128/80   Ht 165.1 cm (5' 5)   Wt (!) 102.2 kg (225 lb 6.4 oz)   BMI 37.51 kg/m  General/Constitutional: No apparent distress: well-nourished and  well developed. Eyes: Pupils equal, round with synchronous movement. Lymphatic: No palpable adenopathy. Respiratory: Patient has good chest rise and fall with inspiration and expiration.   Cardiovascular: Warm and well-perfused Integumentary: No impressive skin lesions present, except as noted in detailed exam. Neuro/Psych: Normal mood and affect, oriented to person, place and time. Musculoskeletal: see exam below  Left Upper Extremity: She has some generalized edema involving the left wrist.  She has vague diffuse mild tenderness to palpation over almost the entirety of the volar and dorsal wrist.  She has about 30 degrees of wrist extension and 20 degrees of wrist flexion.  Negative for CMC grind test, negative thumb adduction test.  No pain to palpation over the first [facility] joint.  No pain to palpation over the first dorsal extensor compartment.  Eickhoff's, negative Finkelstein's, negative pain with thumb extension resistance.  No pain to palpation over the ulnar fovea.  Negative ulnar grind test.  Sensation intact to light touch throughout.  Warm and well-perfused. Imaging and Results: Radiographs of the left wrist obtained on 03/20/2024 were personally reviewed.  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  Minimal metacarpal subsidence noted.  She has mild appearing STT arthrosis as well.   Assessment & Plan: Assessment & Plan Left wrist sprain with associated hand and wrist pain and swelling Left wrist sprain likely from fall, causing swelling and pain. No fractures on x-ray. Pain worsens with movement, causing stiffness.  - Prescribed Medrol Dosepak for inflammation reduction. - Referred to occupational therapy for exercises, swelling management, ROM, desensitization training, pain modalities - Advised wrist brace use with periodic removal for movement. - Continue Voltaren gel for pain relief.  Follow up in 6  weeks  Jackquline CANDIE Barrack, MD [facility] [address] [place], [place] [address] Phone: [phone]  This note was generated in part with voice recognition software; please excuse any typographical errors that were not detected and corrected. This note has been created using automated tools and reviewed for accuracy by [facility].

## 2024-07-07 ENCOUNTER — Encounter (INDEPENDENT_AMBULATORY_CARE_PROVIDER_SITE_OTHER): Payer: Self-pay | Admitting: Vascular Surgery

## 2024-07-07 ENCOUNTER — Other Ambulatory Visit (INDEPENDENT_AMBULATORY_CARE_PROVIDER_SITE_OTHER)

## 2024-07-07 ENCOUNTER — Ambulatory Visit (INDEPENDENT_AMBULATORY_CARE_PROVIDER_SITE_OTHER): Payer: Medicare (Managed Care) | Admitting: Vascular Surgery

## 2024-07-07 ENCOUNTER — Other Ambulatory Visit: Payer: Self-pay | Admitting: Vascular Surgery

## 2024-07-07 VITALS — BP 110/55 | HR 67 | Resp 17 | Ht 66.0 in | Wt 222.2 lb

## 2024-07-07 DIAGNOSIS — I25118 Atherosclerotic heart disease of native coronary artery with other forms of angina pectoris: Secondary | ICD-10-CM

## 2024-07-07 DIAGNOSIS — I10 Essential (primary) hypertension: Secondary | ICD-10-CM

## 2024-07-07 DIAGNOSIS — I6523 Occlusion and stenosis of bilateral carotid arteries: Secondary | ICD-10-CM

## 2024-07-07 DIAGNOSIS — E1142 Type 2 diabetes mellitus with diabetic polyneuropathy: Secondary | ICD-10-CM

## 2024-07-08 ENCOUNTER — Encounter (INDEPENDENT_AMBULATORY_CARE_PROVIDER_SITE_OTHER): Payer: Self-pay | Admitting: Vascular Surgery

## 2024-07-08 NOTE — Progress Notes (Signed)
 MRN : [id number]  Alexandra Hudson is a 73 y.o. (Dec 15, 1950) female who presents with chief complaint of check carotid arteries.  History of Present Illness:   The patient is seen for follow up evaluation of carotid stenosis. The carotid stenosis followed by ultrasound.    The patient denies amaurosis fugax. There is no recent history of TIA symptoms or focal motor deficits. There is no prior documented CVA.   The patient is taking enteric-coated aspirin 81 mg daily.   There is no history of migraine headaches. There is no history of seizures.   The patient has a history of coronary artery disease, no recent episodes of angina or shortness of breath. The patient denies PAD or claudication symptoms. There is a history of hyperlipidemia which is being treated with a statin.     Carotid Duplex done today shows 1-39% bilateral ICA stenosis.  No change compared to last study in 07/17/2022   Current Meds  Medication Sig   aspirin 81 MG chewable tablet Chew by mouth.   aspirin 81 MG tablet Take 81 mg by mouth daily.   cholecalciferol (VITAMIN D) 1000 UNITS tablet Take 1,000 Units by mouth daily.   Cholecalciferol 25 MCG (1000 UT) capsule Take by mouth.   gabapentin (NEURONTIN) 300 MG capsule Take 300-600 mg by mouth 2 (two) times daily. 1 cap in the morning and 2 cap at night   gabapentin (NEURONTIN) 300 MG capsule Take by mouth.   glucose blood (PRECISION QID TEST) test strip Use 3 (three) times daily. Use as instructed. One Touch Ultra   metoprolol  succinate (TOPROL -XL) 50 MG 24 hr tablet Take 50 mg by mouth at bedtime. Take with or immediately following a meal.   mometasone (NASONEX) 50 MCG/ACT nasal spray SPRAY 2 SPRAYS INTO EACH NOSTRIL EVERY DAY   montelukast (SINGULAIR) 10 MG tablet Take by mouth.   Multiple Vitamin (MULTIVITAMIN WITH MINERALS) TABS tablet Take 1 tablet by mouth daily.   ramipril (ALTACE) 10 MG capsule Take 10 mg by mouth daily.    rosuvastatin (CRESTOR) 10 MG tablet Take 20 mg by mouth daily.    sodium fluoride (FLUORISHIELD) 1.1 % GEL dental gel BRUSH WITH PASTE 2-3 TIMES A DAY. DO NOT RINSE   valACYclovir (VALTREX) 1000 MG tablet Take by mouth.    Past Medical History:  Diagnosis Date   Basal cell carcinoma 01/19/2021   left medial cheek. nodular pattern Mohs 02/07/2021   Basal cell carcinoma 02/07/2023   left temple, need Mohs   Coronary artery disease    Diabetes mellitus without complication (HCC)    type 2   Dysplastic nevus 03/18/2024   low back spinal - severe dysplastic - margin free but margin close - may need additional procedure  recheck at follow up   Esophageal dilatation    Hypercholesteremia    Hypertension    Microalbuminuria    Peripheral neuropathy    Postmenopausal bleeding     Past Surgical History:  Procedure Laterality Date    bladder tack  2005   ABDOMINAL HYSTERECTOMY  2012   oophorectomy   BIOPSY BREAST     2024 cancer removed as well as on side of eye   CHOLECYSTECTOMY  04/15/2004   COLONOSCOPY N/A 01/14/2015   Procedure: COLONOSCOPY;  Surgeon: Deward CINDERELLA Piedmont, MD;  Location[facility] ENDOSCOPY;  Service: Gastroenterology;  Laterality:  N/A;   COLONOSCOPY N/A 03/22/2020   Procedure: COLONOSCOPY;  Surgeon: Maryruth Ole DASEN, MD;  Location: [facility] ENDOSCOPY;  Service: Endoscopy;  Laterality: N/A;   CORONARY ANGIOPLASTY WITH STENT PLACEMENT  2006   ESOPHAGOGASTRODUODENOSCOPY  10/14/2007   Dr. Jinny   ganglion cyst Right    TONSILLECTOMY     TOOTH EXTRACTION  02/21/2018   4 teeth removed, 3 on bottom and 1 on top   TUBAL LIGATION     WISDOM TOOTH EXTRACTION  09/09/2018    Social History Social History   Tobacco Use   Smoking status: Never   Smokeless tobacco: Never  Substance Use Topics   Alcohol use: Not Currently   Drug use: Never    Family History Family History  Problem Relation Age of Onset   Breast cancer Maternal Grandmother     No Known Allergies   REVIEW OF  SYSTEMS (Negative unless checked)  Constitutional: [] Weight loss  [] Fever  [] Chills Cardiac: [] Chest pain   [] Chest pressure   [] Palpitations   [] Shortness of breath when laying flat   [] Shortness of breath with exertion. Vascular:  [x] Pain in legs with walking   [] Pain in legs at rest  [] History of DVT   [] Phlebitis   [] Swelling in legs   [] Varicose veins   [] Non-healing ulcers Pulmonary:   [] Uses home oxygen   [] Productive cough   [] Hemoptysis   [] Wheeze  [] COPD   [] Asthma Neurologic:  [] Dizziness   [] Seizures   [] History of stroke   [] History of TIA  [] Aphasia   [] Vissual changes   [] Weakness or numbness in arm   [] Weakness or numbness in leg Musculoskeletal:   [] Joint swelling   [] Joint pain   [] Low back pain Hematologic:  [] Easy bruising  [] Easy bleeding   [] Hypercoagulable state   [] Anemic Gastrointestinal:  [] Diarrhea   [] Vomiting  [] Gastroesophageal reflux/heartburn   [] Difficulty swallowing. Genitourinary:  [] Chronic kidney disease   [] Difficult urination  [] Frequent urination   [] Blood in urine Skin:  [] Rashes   [] Ulcers  Psychological:  [] History of anxiety   []  History of major depression.  Physical Examination  Vitals:   07/07/24 1417  BP: (!) 110/55  Pulse: 67  Resp: 17  Weight: 222 lb 3.2 oz (100.8 kg)  Height: 5' 6 (1.676 m)   Body mass index is 35.86 kg/m. Gen: WD/WN, NAD Head: [place]/AT, No temporalis wasting.  Ear/Nose/Throat: Hearing grossly intact, nares w/o erythema or drainage Eyes: PER, EOMI, sclera nonicteric.  Neck: Supple, no masses.  No bruit or JVD.  Pulmonary:  Good air movement, no audible wheezing, no use of accessory muscles.  Cardiac: RRR, normal S1, S2, no Murmurs. Vascular:   no carotid bruit noted Vessel Right Left  Radial Palpable Palpable  Carotid  Palpable  Palpable  Gastrointestinal: soft, non-distended. No guarding/no peritoneal signs.  Musculoskeletal: M/S 5/5 throughout.  No visible deformity.  Neurologic: CN 2-12 intact. Pain and light  touch intact in extremities.  Symmetrical.  Speech is fluent. Motor exam as listed above. Psychiatric: Judgment intact, Mood & affect appropriate for pt's clinical situation. Dermatologic: No rashes or ulcers noted.  No changes consistent with cellulitis.   CBC Lab Results  Component Value Date   WBC 9.1 09/03/2014   HGB 12.0 09/03/2014   HCT 36.9 09/03/2014   MCV 86 09/03/2014   PLT 172 09/03/2014    BMET    Component Value Date/Time   NA 136 09/03/2014 2240   K 4.1 09/03/2014 2240   CL 103 09/03/2014 2240  CO2 24 09/03/2014 2240   GLUCOSE 202 (H) 09/03/2014 2240   BUN 17 09/03/2014 2240   CREATININE 1.11 09/03/2014 2240   CALCIUM 8.8 09/03/2014 2240   GFRNONAA 53 (L) 09/03/2014 2240   GFRAA >60 09/03/2014 2240   CrCl cannot be calculated (Patient's most recent lab result is older than the maximum 21 days allowed.).  COAG No results found for: INR, PROTIME  Radiology No results found.   Assessment/Plan 1. Bilateral carotid artery stenosis (Primary) Recommend:   Given the patient's asymptomatic subcritical stenosis no further invasive testing or surgery at this time.   Duplex ultrasound shows 1-39% stenosis bilaterally.  Her carotid artery changes have been stable for over 4 years.  At this point I do not feel that she needs routine surveillance.   Continue antiplatelet therapy as prescribed Continue management of CAD, HTN and Hyperlipidemia Healthy heart diet,  encouraged exercise at least 4 times per week.  Follow up PRN   2. Coronary artery disease of native artery of native heart with stable angina pectoris Continue cardiac and antihypertensive medications as already ordered and reviewed, no changes at this time.  Continue statin as ordered and reviewed, no changes at this time  Nitrates PRN for chest pain  3. Benign essential hypertension Continue antihypertensive medications as already ordered, these medications have been reviewed and there are  no changes at this time.  4. Controlled type 2 diabetes mellitus with diabetic polyneuropathy, without long-term current use of insulin (HCC) Continue hypoglycemic medications as already ordered, these medications have been reviewed and there are no changes at this time.  Hgb A1C to be monitored as already arranged by primary service    Cordella Shawl, MD  07/08/2024 3:55 PM

## 2024-09-25 ENCOUNTER — Ambulatory Visit: Admitting: Dermatology

## 2025-03-18 ENCOUNTER — Ambulatory Visit: Admitting: Dermatology
